# Patient Record
Sex: Female | Born: 1990 | Hispanic: Yes | Marital: Married | Smoking: Never smoker
Health system: Southern US, Community
[De-identification: ages and names within clinical notes are randomized; demographics above are authoritative.]

## PROBLEM LIST (undated history)

## (undated) ENCOUNTER — Inpatient Hospital Stay (HOSPITAL_COMMUNITY): Payer: Self-pay

## (undated) DIAGNOSIS — E039 Hypothyroidism, unspecified: Secondary | ICD-10-CM

## (undated) DIAGNOSIS — B009 Herpesviral infection, unspecified: Secondary | ICD-10-CM

## (undated) DIAGNOSIS — R7989 Other specified abnormal findings of blood chemistry: Secondary | ICD-10-CM

## (undated) HISTORY — PX: WISDOM TOOTH EXTRACTION: SHX21

---

## 2011-05-11 ENCOUNTER — Ambulatory Visit (INDEPENDENT_AMBULATORY_CARE_PROVIDER_SITE_OTHER): Payer: Self-pay | Admitting: Family Medicine

## 2011-05-11 VITALS — BP 101/66 | HR 60 | Temp 98.6°F | Resp 16 | Ht 65.58 in | Wt 164.0 lb

## 2011-05-11 DIAGNOSIS — R21 Rash and other nonspecific skin eruption: Secondary | ICD-10-CM

## 2011-05-11 MED ORDER — KETOCONAZOLE 2 % EX CREA: TOPICAL_CREAM | Freq: Two times a day (BID) | CUTANEOUS | Status: AC

## 2011-05-11 NOTE — Progress Notes (Signed)
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  O:  Papular/vesicular erythematous rash right gluteal cleft  A:  Tinea cruris  P:  Ketoconazole 2% cream bid x 7 days

## 2013-05-23 ENCOUNTER — Other Ambulatory Visit: Payer: Self-pay | Admitting: Family Medicine

## 2016-02-29 HISTORY — PX: RHINOPLASTY: SUR1284

## 2016-02-29 NOTE — L&D Delivery Note (Signed)
Delivery Note During second stage, heavy bleeding spontaneously occurred with clotting.  I assessed and determined it was from right vaginal sulcus laceration.  The bleeding was tamponaded by the fetal head.  At 7:05 PM a viable female was delivered via Vaginal, Spontaneous (Presentation: ROA).  APGAR: 9, 9; weight pending.   Placenta status: S, I. 3V Cord with the following complications: none. Cord pH: n/a  Anesthesia: CLEA Episiotomy: None Lacerations: 2nd degree;Perineal; right vaginal sulcus Suture Repair: 3.0 vicryl Est. Blood Loss (mL): 600 (including pre-delivery laceration bleeding; 300 postpartum bleeding)  Mom to postpartum.  Baby to Couplet care / Skin to Skin.  Jody Carr 01/20/2017, 7:48 PM

## 2016-05-31 ENCOUNTER — Encounter (HOSPITAL_COMMUNITY): Payer: Self-pay | Admitting: Emergency Medicine

## 2016-05-31 DIAGNOSIS — Z3A01 Less than 8 weeks gestation of pregnancy: Secondary | ICD-10-CM | POA: Insufficient documentation

## 2016-05-31 DIAGNOSIS — O468X1 Other antepartum hemorrhage, first trimester: Secondary | ICD-10-CM | POA: Insufficient documentation

## 2016-05-31 DIAGNOSIS — O208 Other hemorrhage in early pregnancy: Secondary | ICD-10-CM | POA: Diagnosis present

## 2016-05-31 DIAGNOSIS — O0281 Inappropriate change in quantitative human chorionic gonadotropin (hCG) in early pregnancy: Secondary | ICD-10-CM | POA: Diagnosis not present

## 2016-05-31 LAB — CBC WITH DIFFERENTIAL/PLATELET
BASOS PCT: 0 %
Basophils Absolute: 0 10*3/uL (ref 0.0–0.1)
EOS ABS: 0.1 10*3/uL (ref 0.0–0.7)
EOS PCT: 1 %
HCT: 36.7 % (ref 36.0–46.0)
Hemoglobin: 12.7 g/dL (ref 12.0–15.0)
Lymphocytes Relative: 20 %
Lymphs Abs: 2 10*3/uL (ref 0.7–4.0)
MCH: 29.3 pg (ref 26.0–34.0)
MCHC: 34.6 g/dL (ref 30.0–36.0)
MCV: 84.6 fL (ref 78.0–100.0)
MONO ABS: 0.6 10*3/uL (ref 0.1–1.0)
MONOS PCT: 6 %
Neutro Abs: 7 10*3/uL (ref 1.7–7.7)
Neutrophils Relative %: 73 %
Platelets: 269 10*3/uL (ref 150–400)
RBC: 4.34 MIL/uL (ref 3.87–5.11)
RDW: 12.5 % (ref 11.5–15.5)
WBC: 9.8 10*3/uL (ref 4.0–10.5)

## 2016-05-31 LAB — I-STAT CHEM 8, ED
BUN: 5 mg/dL — ABNORMAL LOW (ref 6–20)
CALCIUM ION: 1.19 mmol/L (ref 1.15–1.40)
Chloride: 99 mmol/L — ABNORMAL LOW (ref 101–111)
Creatinine, Ser: 0.5 mg/dL (ref 0.44–1.00)
Glucose, Bld: 86 mg/dL (ref 65–99)
HEMATOCRIT: 36 % (ref 36.0–46.0)
Hemoglobin: 12.2 g/dL (ref 12.0–15.0)
Potassium: 3.4 mmol/L — ABNORMAL LOW (ref 3.5–5.1)
SODIUM: 137 mmol/L (ref 135–145)
TCO2: 26 mmol/L (ref 0–100)

## 2016-05-31 LAB — ABO/RH: ABO/RH(D): O POS

## 2016-05-31 LAB — HCG, QUANTITATIVE, PREGNANCY: HCG, BETA CHAIN, QUANT, S: 61151 m[IU]/mL — AB (ref <5)

## 2016-05-31 NOTE — ED Triage Notes (Signed)
Patient here with vaginal bleeding.  Patient denies any pain at this time.  Patient states that the bleeding started earlier today.  The bleeding has slowed at this time.  Patient states she is [redacted] weeks pregnant, was supposed to see OBGYN next week.

## 2016-06-01 ENCOUNTER — Emergency Department (HOSPITAL_COMMUNITY)
Admission: EM | Admit: 2016-06-01 | Discharge: 2016-06-01 | Disposition: A | Discharge status: Home / Self Care | Payer: Commercial Managed Care - PPO | Attending: Emergency Medicine | Admitting: Emergency Medicine

## 2016-06-01 ENCOUNTER — Emergency Department (HOSPITAL_COMMUNITY): Payer: Commercial Managed Care - PPO

## 2016-06-01 DIAGNOSIS — O418X10 Other specified disorders of amniotic fluid and membranes, first trimester, not applicable or unspecified: Secondary | ICD-10-CM

## 2016-06-01 DIAGNOSIS — O200 Threatened abortion: Secondary | ICD-10-CM

## 2016-06-01 DIAGNOSIS — O468X1 Other antepartum hemorrhage, first trimester: Secondary | ICD-10-CM

## 2016-06-01 DIAGNOSIS — R58 Hemorrhage, not elsewhere classified: Secondary | ICD-10-CM

## 2016-06-01 NOTE — ED Notes (Signed)
Patient transported to US 

## 2016-06-01 NOTE — Discharge Instructions (Signed)
If you had worsening abdominal pain, worsening bleeding, bleeding through more than 1 pad per hour or had any new or worsening symptoms she needs to be reevaluated. Your ultrasound shows a 6 week, 5 day intrauterine pregnancy.

## 2016-06-01 NOTE — ED Provider Notes (Signed)
[facility]-EMERGENCY DEPT Provider Note   CSN: [id number] Arrival date & time: 05/31/16  2017  By signing my name below, I, Jody Carr, attest that this documentation has been prepared under the direction and in the presence of Shon Baton, MD.  Electronically Signed: Octavia Heir, ED Scribe. 06/01/16. 2:30 AM.    History   Chief Complaint Chief Complaint  Patient presents with  . Vaginal Bleeding    The history is provided by the patient. No language interpreter was used.   HPI Comments: Jody Carr is a 26 y.o. female who presents to the Emergency Department complaining of acute onset, intermittent, vaginal bleeding that began ~ 5 hours ago. She reports that she has been having mild-moderate amounts of blood. She reports associated mild abdominal cramping. Pt further states that she was feeling more fatigued than normal today. She states she is currently ~ [redacted] weeks pregnant. Her LNMP was Feb 14th. This is pt's first pregnancy. She has her first appointment with Ob/Gyn next week. Pt denies dysuria, hematuria, urinary urgency, or urinary frequency.  History reviewed. No pertinent past medical history.  There are no active problems to display for this patient.   History reviewed. No pertinent surgical history.  OB History    Gravida Para Term Preterm AB Living   1             SAB TAB Ectopic Multiple Live Births                   Home Medications    Prior to Admission medications   Medication Sig Start Date End Date Taking? Authorizing Provider  acyclovir (ZOVIRAX) 200 MG capsule Take 200 mg by mouth daily as needed.    Historical Provider, MD    Family History History reviewed. No pertinent family history.  Social History Social History  Substance Use Topics  . Smoking status: Never Smoker  . Smokeless tobacco: Never Used  . Alcohol use Not on file     Allergies   Patient has no known allergies.   Review of Systems Review of Systems    Constitutional: Positive for fatigue.  Gastrointestinal: Positive for abdominal pain (cramping).  Genitourinary: Positive for vaginal bleeding. Negative for dysuria, hematuria and urgency.  All other systems reviewed and are negative.    Physical Exam Updated Vital Signs BP 110/66   Pulse 84   Temp 97.9 F (36.6 C) (Oral)   Resp 18   LMP 04/13/2016 (Exact Date)   SpO2 100%   Physical Exam  Constitutional: She is oriented to person, place, and time. She appears well-developed and well-nourished.  HENT:  Head: Normocephalic and atraumatic.  Cardiovascular: Normal rate, regular rhythm and normal heart sounds.   Pulmonary/Chest: Effort normal and breath sounds normal. No respiratory distress. She has no wheezes.  Abdominal: Soft. Bowel sounds are normal. There is no tenderness. There is no guarding.  Genitourinary:  Genitourinary Comments: Moderate amount of dark blood in the vaginal vault, no obvious clots or products of conception, cervical os closed  Neurological: She is alert and oriented to person, place, and time.  Skin: Skin is warm and dry.  Psychiatric: She has a normal mood and affect.  Nursing note and vitals reviewed.    ED Treatments / Results  DIAGNOSTIC STUDIES: Oxygen Saturation is 100% on RA, normal by my interpretation.  COORDINATION OF CARE:  2:29 AM Discussed treatment plan with pt at bedside and pt agreed to plan.  Labs (all labs ordered are  listed, but only abnormal results are displayed) Labs Reviewed  HCG, QUANTITATIVE, PREGNANCY - Abnormal; Notable for the following:       Result Value   hCG, Beta Chain, Quant, S 61,151 (*)    All other components within normal limits  I-STAT CHEM 8, ED - Abnormal; Notable for the following:    Potassium 3.4 (*)    Chloride 99 (*)    BUN 5 (*)    All other components within normal limits  CBC WITH DIFFERENTIAL/PLATELET  ABO/RH    EKG  EKG Interpretation None       Radiology US Ob Comp Less 14  Wks  Result Date: 06/01/2016 CLINICAL DATA:  26 y/o  F; vaginal bleeding. EXAM: OBSTETRIC <14 WK [place] AND TRANSVAGINAL OB US TECHNIQUE: Both transabdominal and transvaginal ultrasound examinations were performed for complete evaluation of the gestation as well as the maternal uterus, adnexal regions, and pelvic cul-de-sac. Transvaginal technique was performed to assess early pregnancy. COMPARISON:  None. FINDINGS: Intrauterine gestational sac: Single Yolk sac:  Visualized. Embryo:  Visualized. Cardiac Activity: Visualized. Heart Rate: 147  bpm CRL:  8  mm   6 w   5 d                  [place] EDC: 01/20/2017 Subchorionic hemorrhage:  Small. Maternal uterus/adnexae: Lower uterine segment fibroid measuring up to 1.1 cm. Trace physiologic fluid in the pelvis. Otherwise normal. IMPRESSION: 1. Single live intrauterine pregnancy with estimated gestational age of [redacted] weeks and 5 days. 2. Small subchorionic hemorrhage. 3. Lower uterine segment 1.1 cm fibroid. Electronically Signed   By: Mitzi Hansen M.D.   On: 06/01/2016 03:47   US Ob Transvaginal  Result Date: 06/01/2016 CLINICAL DATA:  26 y/o  F; vaginal bleeding. EXAM: OBSTETRIC <14 WK [place] AND TRANSVAGINAL OB US TECHNIQUE: Both transabdominal and transvaginal ultrasound examinations were performed for complete evaluation of the gestation as well as the maternal uterus, adnexal regions, and pelvic cul-de-sac. Transvaginal technique was performed to assess early pregnancy. COMPARISON:  None. FINDINGS: Intrauterine gestational sac: Single Yolk sac:  Visualized. Embryo:  Visualized. Cardiac Activity: Visualized. Heart Rate: 147  bpm CRL:  8  mm   6 w   5 d                  [place] EDC: 01/20/2017 Subchorionic hemorrhage:  Small. Maternal uterus/adnexae: Lower uterine segment fibroid measuring up to 1.1 cm. Trace physiologic fluid in the pelvis. Otherwise normal. IMPRESSION: 1. Single live intrauterine pregnancy with estimated gestational age of [redacted] weeks and 5 days. 2. Small  subchorionic hemorrhage. 3. Lower uterine segment 1.1 cm fibroid. Electronically Signed   By: Mitzi Hansen M.D.   On: 06/01/2016 03:47    Procedures Procedures (including critical care time)  Medications Ordered in ED Medications - No data to display   Initial Impression / Assessment and Plan / ED Course  I have reviewed the triage vital signs and the nursing notes.  Pertinent labs & imaging results that were available during my care of the patient were reviewed by me and considered in my medical decision making (see chart for details).     Patient presents with bleeding during early pregnancy. Nontoxic-appearing. Dark blood in the vaginal vault. Cervical os is closed. Transvaginal ultrasound shows a small subchorionic hemorrhage with a single live intrauterine pregnancy.  Patient was given bleeding precautions. Follow-up with OB. No indication at this time for exam.  After history, exam, and medical workup  I feel the patient has been appropriately medically screened and is safe for discharge home. Pertinent diagnoses were discussed with the patient. Patient was given return precautions.   Final Clinical Impressions(s) / ED Diagnoses   Final diagnoses:  Threatened miscarriage  Subchorionic hematoma in first trimester, single or unspecified fetus    I personally performed the services described in this documentation, which was scribed in my presence. The recorded information has been reviewed and is accurate.  New Prescriptions New Prescriptions   No medications on file     Shon Baton, MD 06/01/16 [phone]

## 2016-06-13 LAB — OB RESULTS CONSOLE HEPATITIS B SURFACE ANTIGEN: HEP B S AG: NEGATIVE

## 2016-06-13 LAB — OB RESULTS CONSOLE GC/CHLAMYDIA
Chlamydia: NEGATIVE
GC PROBE AMP, GENITAL: NEGATIVE

## 2016-06-13 LAB — OB RESULTS CONSOLE HIV ANTIBODY (ROUTINE TESTING): HIV: NONREACTIVE

## 2016-06-13 LAB — OB RESULTS CONSOLE RPR: RPR: NONREACTIVE

## 2016-06-13 LAB — OB RESULTS CONSOLE RUBELLA ANTIBODY, IGM: RUBELLA: IMMUNE

## 2016-07-09 ENCOUNTER — Inpatient Hospital Stay (HOSPITAL_COMMUNITY): Payer: Commercial Managed Care - PPO

## 2016-07-09 ENCOUNTER — Inpatient Hospital Stay (HOSPITAL_COMMUNITY)
Admission: AD | Admit: 2016-07-09 | Discharge: 2016-07-09 | Disposition: A | Discharge status: Home / Self Care | Payer: Commercial Managed Care - PPO | Source: Ambulatory Visit | Attending: Obstetrics and Gynecology | Admitting: Obstetrics and Gynecology

## 2016-07-09 ENCOUNTER — Encounter: Payer: Self-pay | Admitting: Student

## 2016-07-09 DIAGNOSIS — N898 Other specified noninflammatory disorders of vagina: Secondary | ICD-10-CM | POA: Diagnosis not present

## 2016-07-09 DIAGNOSIS — O209 Hemorrhage in early pregnancy, unspecified: Secondary | ICD-10-CM | POA: Diagnosis not present

## 2016-07-09 DIAGNOSIS — O26891 Other specified pregnancy related conditions, first trimester: Secondary | ICD-10-CM | POA: Diagnosis not present

## 2016-07-09 DIAGNOSIS — Z3A12 12 weeks gestation of pregnancy: Secondary | ICD-10-CM | POA: Insufficient documentation

## 2016-07-09 DIAGNOSIS — O208 Other hemorrhage in early pregnancy: Secondary | ICD-10-CM | POA: Insufficient documentation

## 2016-07-09 DIAGNOSIS — Z3491 Encounter for supervision of normal pregnancy, unspecified, first trimester: Secondary | ICD-10-CM

## 2016-07-09 HISTORY — DX: Herpesviral infection, unspecified: B00.9

## 2016-07-09 HISTORY — DX: Other specified abnormal findings of blood chemistry: R79.89

## 2016-07-09 LAB — URINALYSIS, ROUTINE W REFLEX MICROSCOPIC
BILIRUBIN URINE: NEGATIVE
Glucose, UA: NEGATIVE mg/dL
Ketones, ur: NEGATIVE mg/dL
LEUKOCYTES UA: NEGATIVE
NITRITE: NEGATIVE
PH: 7 (ref 5.0–8.0)
Protein, ur: NEGATIVE mg/dL
SPECIFIC GRAVITY, URINE: 1.006 (ref 1.005–1.030)

## 2016-07-09 LAB — POCT FERN TEST: POCT Fern Test: NEGATIVE

## 2016-07-09 IMAGING — US US OB COMP LESS 14 WK
1 series · 15 of 28 positions shown · non-contrast
Comparison: Pelvic ultrasound performed [DATE]

CLINICAL DATA: Acute onset of vaginal spotting. Subsequent
encounter.

EXAM:
OBSTETRIC <14 WK ULTRASOUND
TECHNIQUE: Transabdominal ultrasound was performed for evaluation of the
gestation as well as the maternal uterus and adnexal regions.

[Series 1: us ob comp less 14 wk · 15 of 35 slices shown]
[im 1/35]
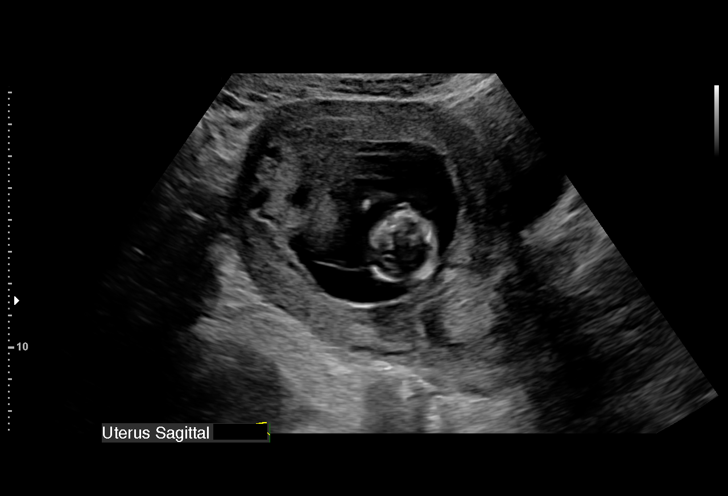
[im 3/35]
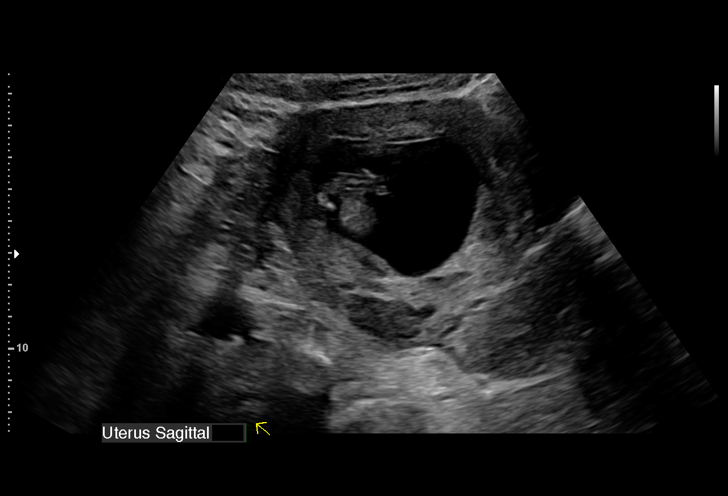
[im 6/35]
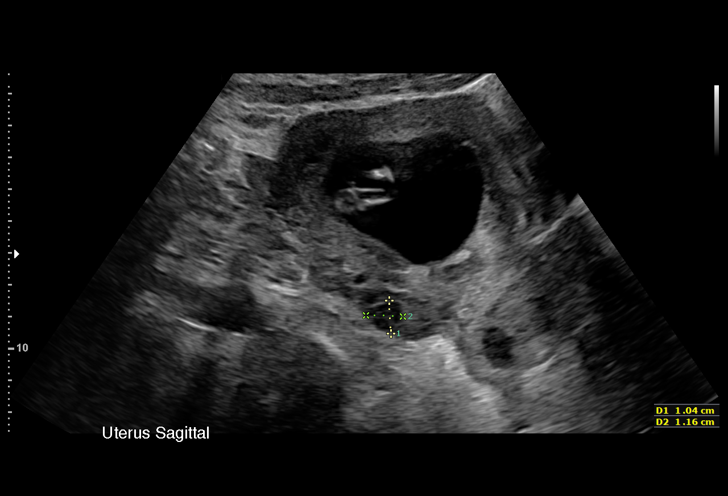
[im 8/35]
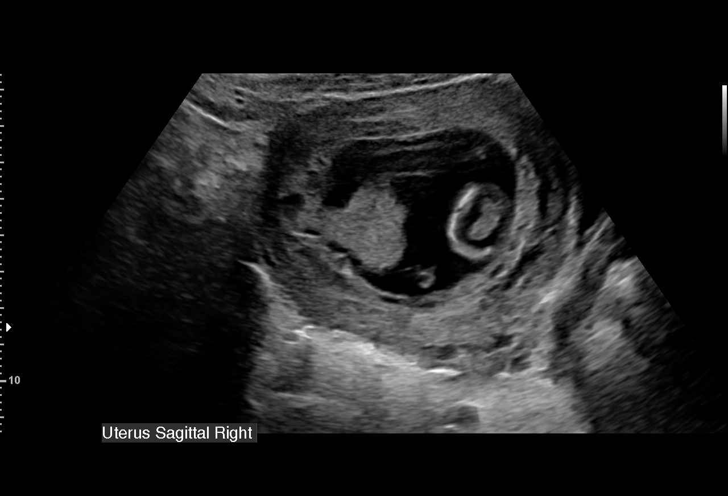
[im 11/35]
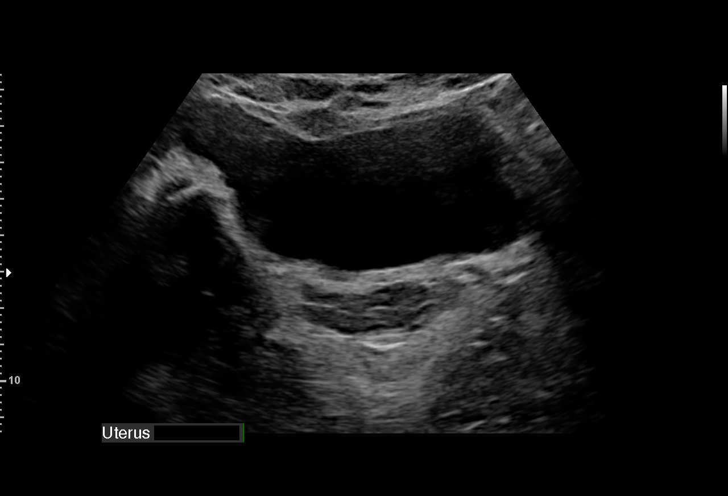
[im 13/35]
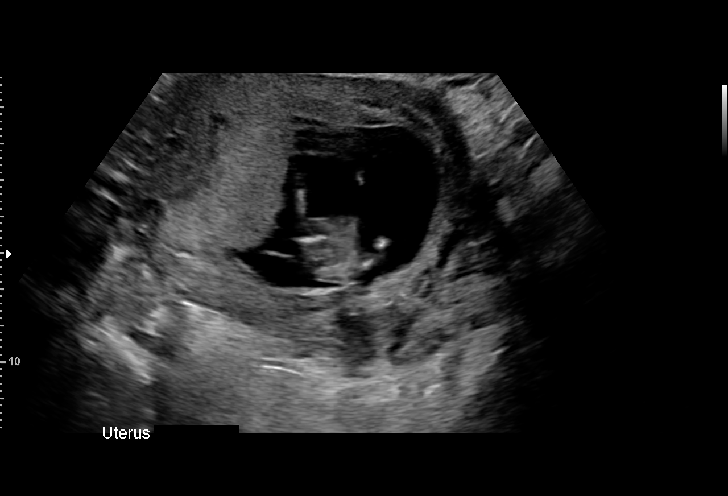
[im 16/35]
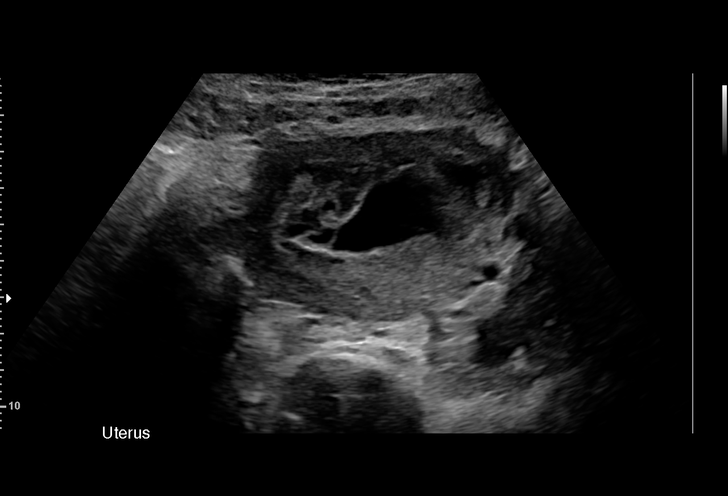
[im 18/35]
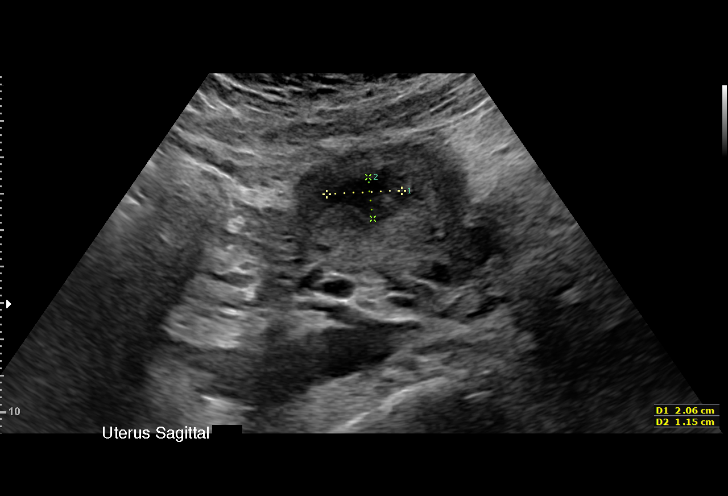
[im 19/35]
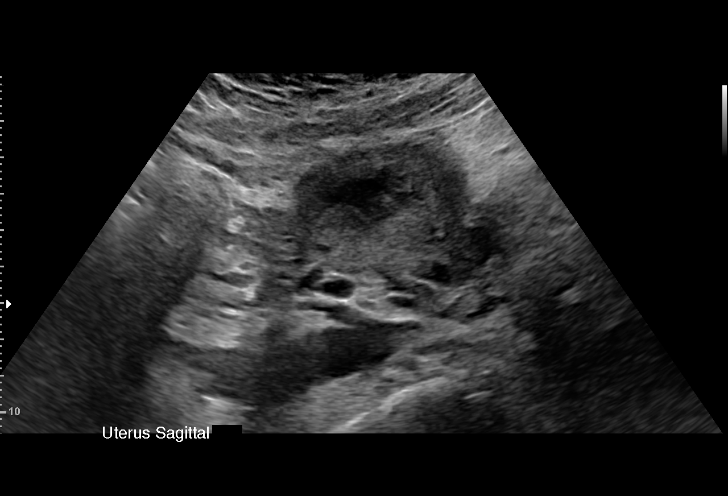
[im 22/35]
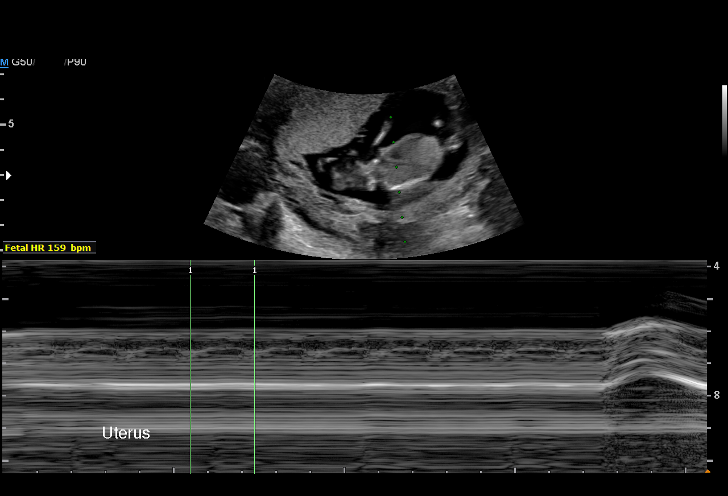
[im 24/35]
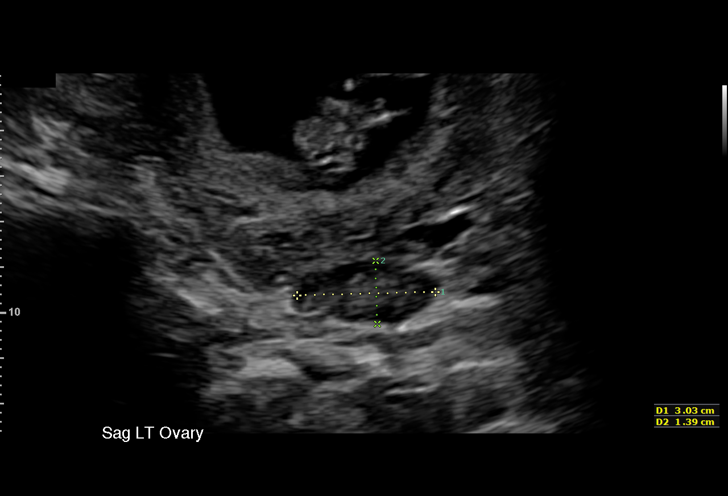
[im 27/35]
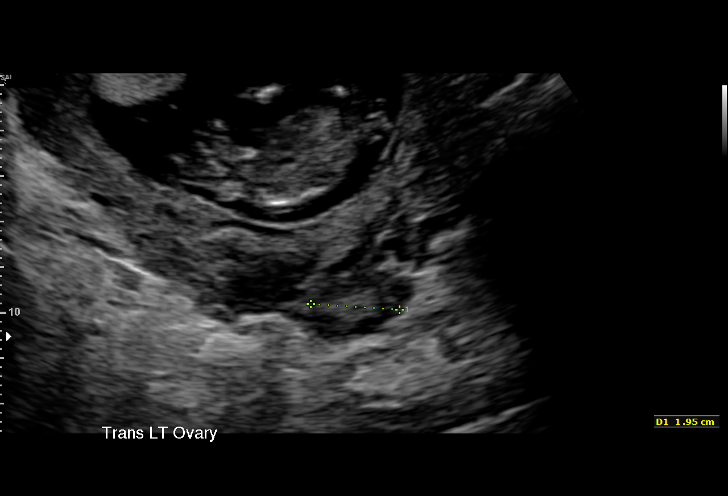
[im 29/35]
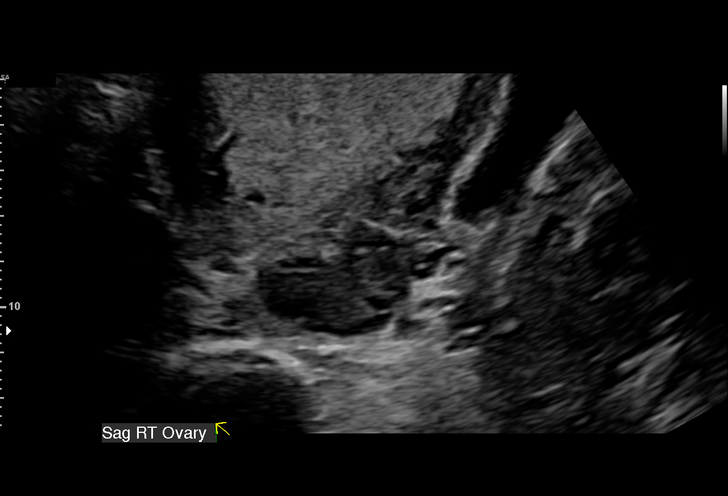
[im 32/35]
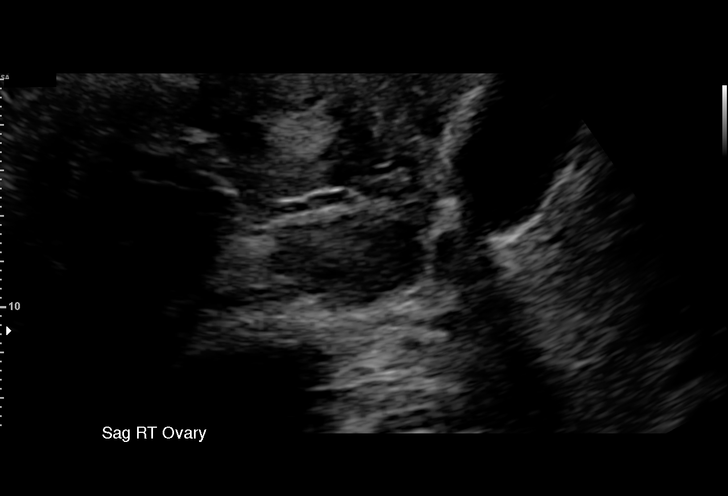
[im 35/35]
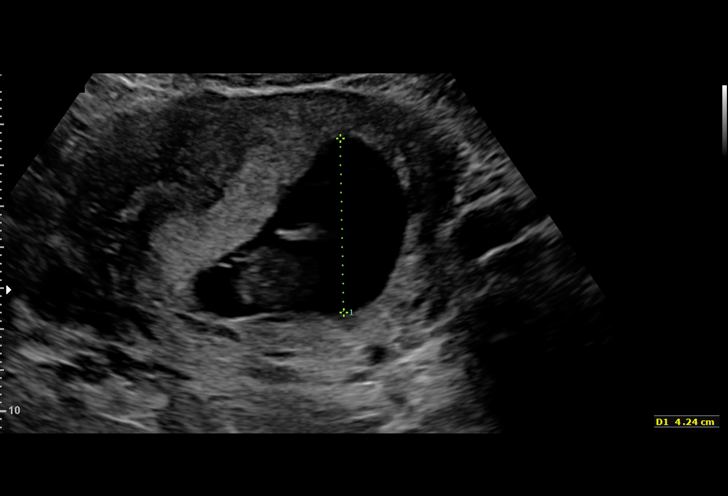

[15 of 28 positions shown; findings below may reference images not displayed]

FINDINGS: Intrauterine gestational sac: Single; visualized and normal in
shape.

Yolk sac:  No

Embryo:  Yes

Cardiac Activity: Yes

Heart Rate: 159 bpm

CRL:   6.18 cm   12 w 4 d                  US EDC: [DATE]

Subchorionic hemorrhage: A small amount of subchorionic hemorrhage
is noted at the uterine fundus.

Maternal uterus/adnexae: A 1.5 cm fibroid is noted at the lower
uterine segment. The uterus is otherwise unremarkable. A
subjectively normal amount of amniotic fluid is seen.

The ovaries are within normal limits. The right ovary measures 3.1 x
2.5 x 1.7 cm, while the left ovary measures 3.0 x 1.4 x 2.0 cm. No
suspicious adnexal masses are seen; there is no evidence for ovarian
torsion.

No free fluid is seen within the pelvic cul-de-sac.
IMPRESSION: 1. Single live intrauterine pregnancy noted, with a crown-rump
length of 6.2 cm, corresponding to a gestational age of 12 weeks 4
days. This matches the gestational age of 12 weeks 3 days by LMP,
reflecting an estimated date of delivery [DATE].
2. Normal amount of amniotic fluid noted.
3. Small amount of subchorionic hemorrhage noted at the uterine
fundus.
4. Small uterine fibroid seen.

## 2016-07-09 NOTE — MAU Provider Note (Signed)
History     CSN: [id number]  Arrival date and time: 07/09/16 0135  First Provider Initiated Contact with Patient 07/09/16 0225      Chief Complaint  Patient presents with  . Rupture of Membranes   HPI Jody Carr is a 26 y.o. G1P0 at 6973w3d who presents with vaginal discharge. Reports leaking blood tinged fluid since 0045 this morning. Describes as clear watery fluid initially that turned blood tinged. Fluid continues to leak out; worse with movements and walking. No odor to discharge. Denies abdominal pain. No recent intercourse. Denies dysuria, n/v/d, fever, or vaginal bleeding.   OB History    Gravida Para Term Preterm AB Living   1             SAB TAB Ectopic Multiple Live Births                  Past Medical History:  Diagnosis Date  . Abnormal TSH   . HSV (herpes simplex virus) infection     Past Surgical History:  Procedure Laterality Date  . RHINOPLASTY  02/2016    Family History  Problem Relation Age of Onset  . Depression Mother   . Hypertension Maternal Grandmother   . Congestive Heart Failure Maternal Grandmother   . Diabetes Paternal Grandmother   . Stroke Paternal Grandfather     Social History  Substance Use Topics  . Smoking status: Never Smoker  . Smokeless tobacco: Never Used  . Alcohol use No    Allergies: No Known Allergies  Prescriptions Prior to Admission  Medication Sig Dispense Refill Last Dose  . acyclovir (ZOVIRAX) 200 MG capsule Take 200 mg by mouth daily as needed.   Taking    Review of Systems  Constitutional: Negative.   Gastrointestinal: Negative.   Genitourinary: Positive for vaginal bleeding and vaginal discharge. Negative for dysuria.   Physical Exam   Blood pressure 121/75, pulse 77, temperature 98.1 F (36.7 C), temperature source Oral, resp. rate 18, height 5' 6.5" (1.689 m), weight 175 lb 4 oz (79.5 kg), last menstrual period 04/13/2016, SpO2 100 %.  Physical Exam  Nursing note and vitals  reviewed. Constitutional: She is oriented to person, place, and time. She appears well-developed and well-nourished. No distress.  HENT:  Head: Normocephalic and atraumatic.  Eyes: Conjunctivae are normal. Right eye exhibits no discharge. Left eye exhibits no discharge. No scleral icterus.  Neck: Normal range of motion.  Respiratory: Effort normal. No respiratory distress.  Genitourinary:  Genitourinary Comments: + pooling. Moderate amount of blood tinged watery fluid. Cervix visually closed.   Neurological: She is alert and oriented to person, place, and time.  Skin: Skin is warm and dry. She is not diaphoretic.  Psychiatric: She has a normal mood and affect. Her behavior is normal. Judgment and thought content normal.    MAU Course  Procedures Results for orders placed or performed during the hospital encounter of 07/09/16 (from the past 24 hour(s))  Urinalysis, Routine w reflex microscopic     Status: Abnormal   Collection Time: 07/09/16  1:54 AM  Result Value Ref Range   Color, Urine STRAW (A) YELLOW   APPearance CLEAR CLEAR   Specific Gravity, Urine 1.006 1.005 - 1.030   pH 7.0 5.0 - 8.0   Glucose, UA NEGATIVE NEGATIVE mg/dL   Hgb urine dipstick SMALL (A) NEGATIVE   Bilirubin Urine NEGATIVE NEGATIVE   Ketones, ur NEGATIVE NEGATIVE mg/dL   Protein, ur NEGATIVE NEGATIVE mg/dL   Nitrite NEGATIVE NEGATIVE  Leukocytes, UA NEGATIVE NEGATIVE   RBC / HPF 0-5 0 - 5 RBC/hpf   WBC, UA 0-5 0 - 5 WBC/hpf   Bacteria, UA RARE (A) NONE SEEN   Squamous Epithelial / LPF 0-5 (A) NONE SEEN   Mucous PRESENT   POCT fern test     Status: None   Collection Time: 07/09/16  3:36 AM  Result Value Ref Range   POCT Fern Test Negative = intact amniotic membranes    US Ob Comp Less 14 Wks  Result Date: 07/09/2016 CLINICAL DATA:  Acute onset of vaginal spotting. Subsequent encounter. EXAM: OBSTETRIC <14 WK ULTRASOUND TECHNIQUE: Transabdominal ultrasound was performed for evaluation of the gestation  as well as the maternal uterus and adnexal regions. COMPARISON:  Pelvic ultrasound performed 06/01/2016 FINDINGS: Intrauterine gestational sac: Single; visualized and normal in shape. Yolk sac:  No Embryo:  Yes Cardiac Activity: Yes Heart Rate: 159 bpm CRL:   6.18 cm   12 w 4 d                  [place] EDC: 01/17/2017 Subchorionic hemorrhage: A small amount of subchorionic hemorrhage is noted at the uterine fundus. Maternal uterus/adnexae: A 1.5 cm fibroid is noted at the lower uterine segment. The uterus is otherwise unremarkable. A subjectively normal amount of amniotic fluid is seen. The ovaries are within normal limits. The right ovary measures 3.1 x 2.5 x 1.7 cm, while the left ovary measures 3.0 x 1.4 x 2.0 cm. No suspicious adnexal masses are seen; there is no evidence for ovarian torsion. No free fluid is seen within the pelvic cul-de-sac. IMPRESSION: 1. Single live intrauterine pregnancy noted, with a crown-rump length of 6.2 cm, corresponding to a gestational age of [redacted] weeks 4 days. This matches the gestational age of [redacted] weeks 3 days by LMP, reflecting an estimated date of delivery of January 18, 2017. 2. Normal amount of amniotic fluid noted. 3. Small amount of subchorionic hemorrhage noted at the uterine fundus. 4. Small uterine fibroid seen. Electronically Signed   By: Roanna Raider M.D.   On: 07/09/2016 03:07     MDM FHT 155 O positive Cervix visually closed + pooling, negative fern Ultrasound -- subjectively normal AVF; 1.5 cm fibroid & small [facility] consistent with last month's study C/w Dr. Henderson Cloud. Discussed exam, fern, & ultrasound. No evidence of infection at this time. Ok to Discharge home. Pt has appt in office on Monday.  Assessment and Plan  A: 1. Fetal heart tones present, first trimester   2. Vaginal bleeding in pregnancy, first trimester   3. Vaginal discharge during pregnancy in first trimester    P: Discharge home Pelvic rest Discussed reasons to return to MAU (including  signs of infection) Keep appt in office on Monday  Judeth Horn 07/09/2016, 2:25 AM

## 2016-07-09 NOTE — MAU Note (Signed)
Pink watery fluid coming out since 1245am, if I move more comes out. Last intercourse a week ago.  No pain.

## 2016-07-09 NOTE — Discharge Instructions (Signed)
Return to care   If you have heavier bleeding that soaks through more that 2 pads per hour for an hour or more  If you bleed so much that you feel like you might pass out or you do pass out  If you have significant abdominal pain that is not improved with Tylenol   If you develop a fever > 100.5      Pelvic Rest Pelvic rest may be recommended if:  Your placenta is partially or completely covering the opening of your cervix (placenta previa).  There is bleeding between the wall of the uterus and the amniotic sac in the first trimester of pregnancy (subchorionic hemorrhage).  You went into labor too early (preterm labor). Based on your overall health and the health of your baby, your health care provider will decide if pelvic rest is right for you. How do I rest my pelvis? For as long as told by your health care provider:  Do not have sex, sexual stimulation, or an orgasm.  Do not use tampons. Do not douche. Do not put anything in your vagina.  Do not lift anything that is heavier than 10 lb (4.5 kg).  Avoid activities that take a lot of effort (are strenuous).  Avoid any activity in which your pelvic muscles could become strained. When should I seek medical care? Seek medical care if you have:  Cramping pain in your lower abdomen.  Vaginal discharge.  A low, dull backache.  Regular contractions.  Uterine tightening. When should I seek immediate medical care? Seek immediate medical care if:  You have vaginal bleeding and you are pregnant. This information is not intended to replace advice given to you by your health care provider. Make sure you discuss any questions you have with your health care provider. Document Released: 06/11/2010 Document Revised: 07/23/2015 Document Reviewed: 08/18/2014 Elsevier Interactive Patient Education  2017 [employer]

## 2016-08-03 ENCOUNTER — Inpatient Hospital Stay (HOSPITAL_COMMUNITY)
Admission: AD | Admit: 2016-08-03 | Payer: Commercial Managed Care - PPO | Source: Ambulatory Visit | Admitting: Obstetrics & Gynecology

## 2016-12-20 LAB — OB RESULTS CONSOLE GBS: GBS: NEGATIVE

## 2017-01-19 ENCOUNTER — Encounter (HOSPITAL_COMMUNITY): Payer: Self-pay

## 2017-01-19 ENCOUNTER — Other Ambulatory Visit: Payer: Self-pay

## 2017-01-19 ENCOUNTER — Inpatient Hospital Stay (HOSPITAL_COMMUNITY)
Admission: AD | Admit: 2017-01-19 | Discharge: 2017-01-20 | Disposition: A | Discharge status: Home / Self Care | Payer: Commercial Managed Care - PPO | Source: Ambulatory Visit | Attending: Obstetrics & Gynecology | Admitting: Obstetrics & Gynecology

## 2017-01-19 ENCOUNTER — Inpatient Hospital Stay (HOSPITAL_COMMUNITY)
Admission: AD | Admit: 2017-01-19 | Discharge: 2017-01-19 | Disposition: A | Discharge status: Home / Self Care | Payer: Commercial Managed Care - PPO | Source: Ambulatory Visit | Attending: Obstetrics & Gynecology | Admitting: Obstetrics & Gynecology

## 2017-01-19 DIAGNOSIS — O479 False labor, unspecified: Secondary | ICD-10-CM

## 2017-01-19 MED ORDER — PROMETHAZINE HCL 25 MG/ML IJ SOLN
25.0000 mg | Freq: Once | INTRAMUSCULAR | Status: AC
  Administered 2017-01-19: 25 mg via INTRAMUSCULAR
  Filled 2017-01-19: qty 1

## 2017-01-19 MED ORDER — MORPHINE SULFATE (PF) 4 MG/ML IV SOLN
5.0000 mg | Freq: Once | INTRAVENOUS | Status: AC
  Administered 2017-01-19: 5 mg via INTRAMUSCULAR
  Filled 2017-01-19: qty 2

## 2017-01-19 NOTE — MAU Note (Signed)
I have communicated with Dr. Langston MaskerMorris and reviewed vital signs:  Vitals:   01/19/17 2120 01/19/17 2330  BP: 128/89 118/76  Pulse: (!) 101 83  Resp: 16   Temp: 98 F (36.7 C)   SpO2: 100%     Vaginal exam:  Dilation: 1 Effacement (%): 90 Cervical Position: Posterior Station: -3 Presentation: Vertex Exam by:: Camelia Enganielle Simpson, RN,   Also reviewed contraction pattern and that non-stress test is reactive.  It has been documented that patient is contracting every 10 minutes with no cervical change over 1 hour not indicating active labor.  Patient denies any other complaints.  Based on this report provider has given order for discharge.  A discharge order and diagnosis entered by a provider.   Labor discharge instructions reviewed with patient.

## 2017-01-19 NOTE — MAU Note (Signed)
Contractions started at 6 p.m. Last night. Got to every 6 min, then started slowing down, now stuck at every .  Some bleeding after exam yesterday, was 1 cm when checked. No leaking. No problems with preg.

## 2017-01-19 NOTE — Discharge Instructions (Signed)
Fetal Movement Counts °Patient Name: ________________________________________________ Patient Due Date: ____________________ °What is a fetal movement count? °A fetal movement count is the number of times that you feel your baby move during a certain amount of time. This may also be called a fetal kick count. A fetal movement count is recommended for every pregnant woman. You may be asked to start counting fetal movements as early as week 28 of your pregnancy. °Pay attention to when your baby is most active. You may notice your baby's sleep and wake cycles. You may also notice things that make your baby move more. You should do a fetal movement count: °· When your baby is normally most active. °· At the same time each day. ° °A good time to count movements is while you are resting, after having something to eat and drink. °How do I count fetal movements? °1. Find a quiet, comfortable area. Sit, or lie down on your side. °2. Write down the date, the start time and stop time, and the number of movements that you felt between those two times. Take this information with you to your health care visits. °3. For 2 hours, count kicks, flutters, swishes, rolls, and jabs. You should feel at least 10 movements during 2 hours. °4. You may stop counting after you have felt 10 movements. °5. If you do not feel 10 movements in 2 hours, have something to eat and drink. Then, keep resting and counting for 1 hour. If you feel at least 4 movements during that hour, you may stop counting. °Contact a health care provider if: °· You feel fewer than 4 movements in 2 hours. °· Your baby is not moving like he or she usually does. °Date: ____________ Start time: ____________ Stop time: ____________ Movements: ____________ °Date: ____________ Start time: ____________ Stop time: ____________ Movements: ____________ °Date: ____________ Start time: ____________ Stop time: ____________ Movements: ____________ °Date: ____________ Start time:  ____________ Stop time: ____________ Movements: ____________ °Date: ____________ Start time: ____________ Stop time: ____________ Movements: ____________ °Date: ____________ Start time: ____________ Stop time: ____________ Movements: ____________ °Date: ____________ Start time: ____________ Stop time: ____________ Movements: ____________ °Date: ____________ Start time: ____________ Stop time: ____________ Movements: ____________ °Date: ____________ Start time: ____________ Stop time: ____________ Movements: ____________ °This information is not intended to replace advice given to you by your health care provider. Make sure you discuss any questions you have with your health care provider. °Document Released: 03/16/2006 Document Revised: 10/14/2015 Document Reviewed: 03/26/2015 °Elsevier Interactive Patient Education © 2018 [employer] °Braxton Hicks Contractions °Contractions of the uterus can occur throughout pregnancy, but they are not always a sign that you are in labor. You may have practice contractions called Braxton Hicks contractions. These false labor contractions are sometimes confused with true labor. °What are Braxton Hicks contractions? °Braxton Hicks contractions are tightening movements that occur in the muscles of the uterus before labor. Unlike true labor contractions, these contractions do not result in opening (dilation) and thinning of the cervix. Toward the end of pregnancy (32-34 weeks), Braxton Hicks contractions can happen more often and may become stronger. These contractions are sometimes difficult to tell apart from true labor because they can be very uncomfortable. You should not feel embarrassed if you go to the hospital with false labor. °Sometimes, the only way to tell if you are in true labor is for your health care provider to look for changes in the cervix. The health care provider will do a physical exam and may monitor your contractions. If   you are not in true labor, the exam  should show that your cervix is not dilating and your water has not broken. °If there are no prenatal problems or other health problems associated with your pregnancy, it is completely safe for you to be sent home with false labor. You may continue to have Braxton Hicks contractions until you go into true labor. °How can I tell the difference between true labor and false labor? °· Differences °? False labor °? Contractions last 30-70 seconds.: Contractions are usually shorter and not as strong as true labor contractions. °? Contractions become very regular.: Contractions are usually irregular. °? Discomfort is usually felt in the top of the uterus, and it spreads to the lower abdomen and low back.: Contractions are often felt in the front of the lower abdomen and in the groin. °? Contractions do not go away with walking.: Contractions may go away when you walk around or change positions while lying down. °? Contractions usually become more intense and increase in frequency.: Contractions get weaker and are shorter-lasting as time goes on. °? The cervix dilates and gets thinner.: The cervix usually does not dilate or become thin. °Follow these instructions at home: °· Take over-the-counter and prescription medicines only as told by your health care provider. °· Keep up with your usual exercises and follow other instructions from your health care provider. °· Eat and drink lightly if you think you are going into labor. °· If Braxton Hicks contractions are making you uncomfortable: °? Change your position from lying down or resting to walking, or change from walking to resting. °? Sit and rest in a tub of warm water. °? Drink enough fluid to keep your urine clear or pale yellow. Dehydration may cause these contractions. °? Do slow and deep breathing several times an hour. °· Keep all follow-up prenatal visits as told by your health care provider. This is important. °Contact a health care provider if: °· You have a  fever. °· You have continuous pain in your abdomen. °Get help right away if: °· Your contractions become stronger, more regular, and closer together. °· You have fluid leaking or gushing from your vagina. °· You pass blood-tinged mucus (bloody show). °· You have bleeding from your vagina. °· You have low back pain that you never had before. °· You feel your baby’s head pushing down and causing pelvic pressure. °· Your baby is not moving inside you as much as it used to. °Summary °· Contractions that occur before labor are called Braxton Hicks contractions, false labor, or practice contractions. °· Braxton Hicks contractions are usually shorter, weaker, farther apart, and less regular than true labor contractions. True labor contractions usually become progressively stronger and regular and they become more frequent. °· Manage discomfort from Braxton Hicks contractions by changing position, resting in a warm bath, drinking plenty of water, or practicing deep breathing. °This information is not intended to replace advice given to you by your health care provider. Make sure you discuss any questions you have with your health care provider. °Document Released: 02/14/2005 Document Revised: 01/04/2016 Document Reviewed: 01/04/2016 °Elsevier Interactive Patient Education © 2017 [employer]. ° °

## 2017-01-19 NOTE — Discharge Instructions (Signed)

## 2017-01-19 NOTE — MAU Note (Signed)
Pt states that she was here this morning, but was sent home. She returns now with ctx every 10 minutes and she describes the pain as unbearable. She rates the ctx 10/10 intermittent. She states the pain is located in her back radiating to lower bottoms like something is trying to come out.  Denies vag bleeding. Denies any leaking of fluid.

## 2017-01-20 ENCOUNTER — Inpatient Hospital Stay (HOSPITAL_COMMUNITY)
Admission: AD | Admit: 2017-01-20 | Discharge: 2017-01-22 | DRG: 806 | Disposition: A | Discharge status: Home / Self Care | Payer: Commercial Managed Care - PPO | Source: Ambulatory Visit | Attending: Obstetrics & Gynecology | Admitting: Obstetrics & Gynecology

## 2017-01-20 ENCOUNTER — Encounter (HOSPITAL_COMMUNITY): Payer: Self-pay | Admitting: Anesthesiology

## 2017-01-20 ENCOUNTER — Inpatient Hospital Stay (HOSPITAL_COMMUNITY): Payer: Commercial Managed Care - PPO | Admitting: Anesthesiology

## 2017-01-20 ENCOUNTER — Encounter (HOSPITAL_COMMUNITY): Payer: Self-pay | Admitting: *Deleted

## 2017-01-20 ENCOUNTER — Other Ambulatory Visit: Payer: Self-pay

## 2017-01-20 DIAGNOSIS — Z3A40 40 weeks gestation of pregnancy: Secondary | ICD-10-CM

## 2017-01-20 DIAGNOSIS — Z3490 Encounter for supervision of normal pregnancy, unspecified, unspecified trimester: Secondary | ICD-10-CM

## 2017-01-20 DIAGNOSIS — E039 Hypothyroidism, unspecified: Secondary | ICD-10-CM | POA: Diagnosis present

## 2017-01-20 DIAGNOSIS — Z3483 Encounter for supervision of other normal pregnancy, third trimester: Secondary | ICD-10-CM | POA: Diagnosis present

## 2017-01-20 DIAGNOSIS — A6000 Herpesviral infection of urogenital system, unspecified: Secondary | ICD-10-CM | POA: Diagnosis present

## 2017-01-20 DIAGNOSIS — O9832 Other infections with a predominantly sexual mode of transmission complicating childbirth: Secondary | ICD-10-CM | POA: Diagnosis present

## 2017-01-20 DIAGNOSIS — O99284 Endocrine, nutritional and metabolic diseases complicating childbirth: Principal | ICD-10-CM | POA: Diagnosis present

## 2017-01-20 LAB — CBC
HCT: 40.1 % (ref 36.0–46.0)
HEMOGLOBIN: 13.8 g/dL (ref 12.0–15.0)
MCH: 30.5 pg (ref 26.0–34.0)
MCHC: 34.4 g/dL (ref 30.0–36.0)
MCV: 88.5 fL (ref 78.0–100.0)
Platelets: 228 10*3/uL (ref 150–400)
RBC: 4.53 MIL/uL (ref 3.87–5.11)
RDW: 13.1 % (ref 11.5–15.5)
WBC: 15.6 10*3/uL — AB (ref 4.0–10.5)

## 2017-01-20 LAB — TYPE AND SCREEN
ABO/RH(D): O POS
ANTIBODY SCREEN: NEGATIVE

## 2017-01-20 LAB — ABO/RH: ABO/RH(D): O POS

## 2017-01-20 LAB — RPR: RPR: NONREACTIVE

## 2017-01-20 MED ORDER — PHENYLEPHRINE 40 MCG/ML (10ML) SYRINGE FOR IV PUSH (FOR BLOOD PRESSURE SUPPORT)
80.0000 ug | PREFILLED_SYRINGE | INTRAVENOUS | Status: DC | PRN
  Filled 2017-01-20: qty 5

## 2017-01-20 MED ORDER — EPHEDRINE 5 MG/ML INJ
10.0000 mg | INTRAVENOUS | Status: DC | PRN
  Filled 2017-01-20: qty 2

## 2017-01-20 MED ORDER — OXYCODONE-ACETAMINOPHEN 5-325 MG PO TABS: 1.0000 | ORAL_TABLET | ORAL | Status: DC | PRN

## 2017-01-20 MED ORDER — FENTANYL 2.5 MCG/ML BUPIVACAINE 1/10 % EPIDURAL INFUSION (WH - ANES)
14.0000 mL/h | INTRAMUSCULAR | Status: DC | PRN
  Administered 2017-01-20 (×2): 14 mL/h via EPIDURAL
  Filled 2017-01-20 (×2): qty 100

## 2017-01-20 MED ORDER — ONDANSETRON HCL 4 MG/2ML IJ SOLN
4.0000 mg | Freq: Four times a day (QID) | INTRAMUSCULAR | Status: DC | PRN
  Administered 2017-01-20: 4 mg via INTRAVENOUS
  Filled 2017-01-20: qty 2

## 2017-01-20 MED ORDER — ONDANSETRON HCL 4 MG/2ML IJ SOLN: 4.0000 mg | INTRAMUSCULAR | Status: DC | PRN

## 2017-01-20 MED ORDER — DIPHENHYDRAMINE HCL 25 MG PO CAPS: 25.0000 mg | ORAL_CAPSULE | Freq: Four times a day (QID) | ORAL | Status: DC | PRN

## 2017-01-20 MED ORDER — LACTATED RINGERS IV SOLN: 500.0000 mL | INTRAVENOUS | Status: DC | PRN

## 2017-01-20 MED ORDER — ZOLPIDEM TARTRATE 5 MG PO TABS: 5.0000 mg | ORAL_TABLET | Freq: Every evening | ORAL | Status: DC | PRN

## 2017-01-20 MED ORDER — SOD CITRATE-CITRIC ACID 500-334 MG/5ML PO SOLN
30.0000 mL | ORAL | Status: DC | PRN
Start: 2017-01-20 — End: 2017-01-20

## 2017-01-20 MED ORDER — DIBUCAINE 1 % RE OINT: 1.0000 "application " | TOPICAL_OINTMENT | RECTAL | Status: DC | PRN

## 2017-01-20 MED ORDER — LIDOCAINE HCL (PF) 1 % IJ SOLN
INTRAMUSCULAR | Status: DC | PRN
  Administered 2017-01-20 (×2): 4 mL

## 2017-01-20 MED ORDER — FENTANYL CITRATE (PF) 100 MCG/2ML IJ SOLN
INTRAMUSCULAR | Status: AC
  Filled 2017-01-20: qty 2

## 2017-01-20 MED ORDER — FENTANYL CITRATE (PF) 100 MCG/2ML IJ SOLN
100.0000 ug | Freq: Once | INTRAMUSCULAR | Status: AC
  Administered 2017-01-20: 100 ug via INTRAVENOUS

## 2017-01-20 MED ORDER — ACETAMINOPHEN 325 MG PO TABS
650.0000 mg | ORAL_TABLET | ORAL | Status: DC | PRN
  Administered 2017-01-21: 650 mg via ORAL
  Filled 2017-01-20: qty 2

## 2017-01-20 MED ORDER — WITCH HAZEL-GLYCERIN EX PADS: 1.0000 "application " | MEDICATED_PAD | CUTANEOUS | Status: DC | PRN

## 2017-01-20 MED ORDER — DIPHENHYDRAMINE HCL 50 MG/ML IJ SOLN: 12.5000 mg | INTRAMUSCULAR | Status: DC | PRN

## 2017-01-20 MED ORDER — LEVOTHYROXINE SODIUM 50 MCG PO TABS
50.0000 ug | ORAL_TABLET | Freq: Every day | ORAL | Status: DC
Start: 2017-01-21 — End: 2017-01-22
  Administered 2017-01-21 – 2017-01-22 (×2): 50 ug via ORAL
  Filled 2017-01-20 (×3): qty 1

## 2017-01-20 MED ORDER — PRENATAL MULTIVITAMIN CH
1.0000 | ORAL_TABLET | Freq: Every day | ORAL | Status: DC
  Administered 2017-01-21 – 2017-01-22 (×2): 1 via ORAL
  Filled 2017-01-20 (×2): qty 1

## 2017-01-20 MED ORDER — ACETAMINOPHEN 325 MG PO TABS: 650.0000 mg | ORAL_TABLET | ORAL | Status: DC | PRN

## 2017-01-20 MED ORDER — OXYTOCIN 40 UNITS IN LACTATED RINGERS INFUSION - SIMPLE MED
2.5000 [IU]/h | INTRAVENOUS | Status: DC
  Filled 2017-01-20: qty 1000

## 2017-01-20 MED ORDER — ACETAMINOPHEN 500 MG PO TABS
1000.0000 mg | ORAL_TABLET | Freq: Four times a day (QID) | ORAL | Status: DC | PRN
Start: 2017-01-20 — End: 2017-01-22
  Administered 2017-01-20: 1000 mg via ORAL
  Filled 2017-01-20: qty 2

## 2017-01-20 MED ORDER — FENTANYL 2.5 MCG/ML BUPIVACAINE 1/10 % EPIDURAL INFUSION (WH - ANES)
14.0000 mL/h | INTRAMUSCULAR | Status: DC | PRN
Start: 2017-01-20 — End: 2017-01-20

## 2017-01-20 MED ORDER — IBUPROFEN 600 MG PO TABS
600.0000 mg | ORAL_TABLET | Freq: Four times a day (QID) | ORAL | Status: DC
  Administered 2017-01-20 – 2017-01-22 (×8): 600 mg via ORAL
  Filled 2017-01-20 (×7): qty 1

## 2017-01-20 MED ORDER — OXYCODONE-ACETAMINOPHEN 5-325 MG PO TABS: 2.0000 | ORAL_TABLET | ORAL | Status: DC | PRN

## 2017-01-20 MED ORDER — FLEET ENEMA 7-19 GM/118ML RE ENEM: 1.0000 | ENEMA | RECTAL | Status: DC | PRN

## 2017-01-20 MED ORDER — SIMETHICONE 80 MG PO CHEW: 80.0000 mg | CHEWABLE_TABLET | ORAL | Status: DC | PRN

## 2017-01-20 MED ORDER — BENZOCAINE-MENTHOL 20-0.5 % EX AERO
1.0000 "application " | INHALATION_SPRAY | CUTANEOUS | Status: DC | PRN
  Filled 2017-01-20: qty 56

## 2017-01-20 MED ORDER — OXYTOCIN 40 UNITS IN LACTATED RINGERS INFUSION - SIMPLE MED
1.0000 m[IU]/min | INTRAVENOUS | Status: DC
  Administered 2017-01-20: 2 m[IU]/min via INTRAVENOUS

## 2017-01-20 MED ORDER — ONDANSETRON HCL 4 MG PO TABS: 4.0000 mg | ORAL_TABLET | ORAL | Status: DC | PRN

## 2017-01-20 MED ORDER — SENNOSIDES-DOCUSATE SODIUM 8.6-50 MG PO TABS
2.0000 | ORAL_TABLET | ORAL | Status: DC
  Administered 2017-01-20 – 2017-01-21 (×2): 2 via ORAL
  Filled 2017-01-20 (×2): qty 2

## 2017-01-20 MED ORDER — LACTATED RINGERS IV SOLN
500.0000 mL | Freq: Once | INTRAVENOUS | Status: AC
  Administered 2017-01-20: 500 mL via INTRAVENOUS

## 2017-01-20 MED ORDER — TERBUTALINE SULFATE 1 MG/ML IJ SOLN
0.2500 mg | Freq: Once | INTRAMUSCULAR | Status: DC | PRN
  Filled 2017-01-20: qty 1

## 2017-01-20 MED ORDER — TETANUS-DIPHTH-ACELL PERTUSSIS 5-2.5-18.5 LF-MCG/0.5 IM SUSP: 0.5000 mL | Freq: Once | INTRAMUSCULAR | Status: DC

## 2017-01-20 MED ORDER — LACTATED RINGERS IV SOLN: INTRAVENOUS | Status: DC

## 2017-01-20 MED ORDER — COCONUT OIL OIL
1.0000 | TOPICAL_OIL | Status: DC | PRN
Start: 2017-01-20 — End: 2017-01-22
  Administered 2017-01-21: 1 via TOPICAL
  Filled 2017-01-20: qty 120

## 2017-01-20 MED ORDER — LIDOCAINE HCL (PF) 1 % IJ SOLN
30.0000 mL | INTRAMUSCULAR | Status: DC | PRN
  Filled 2017-01-20: qty 30

## 2017-01-20 MED ORDER — OXYTOCIN BOLUS FROM INFUSION
500.0000 mL | Freq: Once | INTRAVENOUS | Status: AC
  Administered 2017-01-20: 500 mL via INTRAVENOUS

## 2017-01-20 MED ORDER — PHENYLEPHRINE 40 MCG/ML (10ML) SYRINGE FOR IV PUSH (FOR BLOOD PRESSURE SUPPORT)
80.0000 ug | PREFILLED_SYRINGE | INTRAVENOUS | Status: DC | PRN
  Filled 2017-01-20: qty 10
  Filled 2017-01-20: qty 5

## 2017-01-20 MED ORDER — LACTATED RINGERS IV SOLN
INTRAVENOUS | Status: DC
  Administered 2017-01-20 (×3): via INTRAVENOUS

## 2017-01-20 NOTE — Anesthesia Pain Management Evaluation Note (Signed)
  CRNA Pain Management Visit Note  Patient: Jody SpatzJasmin A Habig, 26 y.o., female  "Hello [facility] am a [facility]. We have an anesthesia team available at all times to provide care throughout the hospital, including epidural management and anesthesia for C-section. I don't know your plan for the delivery whether it a natural birth, water birth, IV sedation, nitrous supplementation, doula or epidural, but we want to meet your pain goals."   1.Was your pain managed to your expectations on prior hospitalizations?   No prior hospitalizations  2.What is your expectation for pain management during this hospitalization?     Epidural  3.How can we help you reach that goal? epidural  Record the patient's initial score and the patient's pain goal.   Pain: 8  Pain Goal: 4 [facility] wants you to be able to say your pain was always managed very well.  Latrell Reitan 01/20/2017

## 2017-01-20 NOTE — MAU Note (Signed)
Pt seen in MAU twice yesterday, DC'd home.  Has continued to have severe contractions, unable to rest.  Uc's are closer together, denies bleeding or LOF.

## 2017-01-20 NOTE — Progress Notes (Signed)
RN attempted to get pt up to bathroom. Pt stating "feeling like passing out" RN placed back in bed. 500cc  IV fluid bolus started at 2040. Pt awake, alert and oriented x4. BP 105/78 pulse 119 Resp 19 and O2 sats at 100%

## 2017-01-20 NOTE — Anesthesia Procedure Notes (Signed)
Epidural Patient location during procedure: OB  Staffing Anesthesiologist: Shameek Nyquist, MD Performed: anesthesiologist   Preanesthetic Checklist Completed: patient identified, pre-op evaluation, timeout performed, IV checked, risks and benefits discussed and monitors and equipment checked  Epidural Patient position: sitting Prep: site prepped and draped and DuraPrep Patient monitoring: heart rate, continuous pulse ox and blood pressure Approach: midline Location: L3-L4 Injection technique: LOR air and LOR saline  Needle:  Needle type: Tuohy  Needle gauge: 17 G Needle length: 9 cm Needle insertion depth: 5 cm Catheter type: closed end flexible Catheter size: 19 Gauge Catheter at skin depth: 10 cm Test dose: negative  Assessment Sensory level: T8 Events: blood not aspirated, injection not painful, no injection resistance, negative IV test and no paresthesia  Additional Notes Reason for block:procedure for pain     

## 2017-01-20 NOTE — H&P (Signed)
Jody Carr is a 26 y.o. female presenting for labor; CTX started 48 hours ago and have increased in intensity and frequency.  No LOF or VB.  Active FM.  Antepartum course complicated by hypothyroidism on Synthroid.  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 GBS negative.    OB History    Gravida Para Term Preterm AB Living   1             SAB TAB Ectopic Multiple Live Births                 Past Medical History:  Diagnosis Date  . Abnormal TSH   . HSV (herpes simplex virus) infection    Past Surgical History:  Procedure Laterality Date  . RHINOPLASTY  02/2016   Family History: family history includes Congestive Heart Failure in her maternal grandmother; Depression in her mother; Diabetes in her paternal grandmother; Hypertension in her maternal grandmother; Stroke in her paternal grandfather. Social History:  reports that  has never smoked. she has never used smokeless tobacco. She reports that she does not drink alcohol or use drugs.     Maternal Diabetes: No Genetic Screening: Normal Maternal Ultrasounds/Referrals: Normal Fetal Ultrasounds or other Referrals:  None Maternal Substance Abuse:  No Significant Maternal Medications:  Meds include: Syntroid Other: Valtrex Significant Maternal Lab Results:  Lab values include: Group B Strep negative Other Comments:  None  ROS Maternal Medical History:  Reason for admission: Contractions.   Contractions: Onset was more than 2 days ago.   Frequency: regular.   Perceived severity is moderate.    Fetal activity: Perceived fetal activity is normal.   Last perceived fetal movement was within the past hour.    Prenatal complications: no prenatal complications Prenatal Complications - Diabetes: none.    Dilation: 5 Effacement (%): 90 Station: -3 Exam by:: Dorrene GermanJ. Lowe RN Blood pressure (!) 144/92, pulse 89, temperature 98.2 F (36.8 C), resp. rate 18, height 5\' 6"  (1.676 m), weight 195 lb (88.5  kg), last menstrual period 04/13/2016. Maternal Exam:  Uterine Assessment: Contraction strength is moderate.  Contraction frequency is regular.   Abdomen: Patient reports no abdominal tenderness. Fundal height is c/w dates.   Estimated fetal weight is 8#.       Physical Exam  Constitutional: She is oriented to person, place, and time. She appears well-developed and well-nourished.  GI: Soft. There is no rebound and no guarding.  Neurological: She is alert and oriented to person, place, and time.  Skin: Skin is warm and dry.  Psychiatric: She has a normal mood and affect. Her behavior is normal.    Prenatal labs: ABO, Rh: --/--/O POS (04/03 2108) Antibody:   Rubella: Immune (04/16 0000) RPR: Nonreactive (04/16 0000)  HBsAg: Negative (04/16 0000)  HIV: Non-reactive (04/16 0000)  GBS: Negative (10/23 0000)   Assessment/Plan: 26yo G1 at 2458w2d with labor -Epidural -Anticipate NSVD   Lamine Laton 01/20/2017, 9:22 AM

## 2017-01-20 NOTE — Anesthesia Preprocedure Evaluation (Signed)
Anesthesia Evaluation  Patient identified by MRN, date of birth, ID band Patient awake    Reviewed: Allergy & Precautions, NPO status , Patient's Chart, lab work & pertinent test results  Airway Mallampati: II  TM Distance: >3 FB Neck ROM: Full    Dental no notable dental hx.    Pulmonary neg pulmonary ROS,    Pulmonary exam normal breath sounds clear to auscultation       Cardiovascular negative cardio ROS Normal cardiovascular exam Rhythm:Regular Rate:Normal     Neuro/Psych negative neurological ROS  negative psych ROS   GI/Hepatic negative GI ROS, Neg liver ROS,   Endo/Other  negative endocrine ROS  Renal/GU negative Renal ROS     Musculoskeletal negative musculoskeletal ROS (+)   Abdominal   Peds  Hematology negative hematology ROS (+)   Anesthesia Other Findings   Reproductive/Obstetrics (+) Pregnancy                             Anesthesia Physical Anesthesia Plan  ASA: II  Anesthesia Plan: Epidural   Post-op Pain Management:    Induction:   PONV Risk Score and Plan:   Airway Management Planned:   Additional Equipment:   Intra-op Plan:   Post-operative Plan:   Informed Consent: I have reviewed the patients History and Physical, chart, labs and discussed the procedure including the risks, benefits and alternatives for the proposed anesthesia with the patient or authorized representative who has indicated his/her understanding and acceptance.       Plan Discussed with:   Anesthesia Plan Comments:         Anesthesia Quick Evaluation  

## 2017-01-20 NOTE — Progress Notes (Signed)
No current c/o.  Comfortable with CLEA.  CVX 9/c/0, arom, clear FHT Cat I Pit 8  Harrah's EntertainmentMegan Coreyon Nicotra, DO

## 2017-01-21 LAB — CBC
HEMATOCRIT: 28.8 % — AB (ref 36.0–46.0)
Hemoglobin: 9.9 g/dL — ABNORMAL LOW (ref 12.0–15.0)
MCH: 31 pg (ref 26.0–34.0)
MCHC: 34.7 g/dL (ref 30.0–36.0)
MCV: 89.2 fL (ref 78.0–100.0)
PLATELETS: 180 10*3/uL (ref 150–400)
RBC: 3.23 MIL/uL — ABNORMAL LOW (ref 3.87–5.11)
RDW: 13.3 % (ref 11.5–15.5)
WBC: 17.3 10*3/uL — AB (ref 4.0–10.5)

## 2017-01-21 NOTE — Anesthesia Postprocedure Evaluation (Signed)
Anesthesia Post Note  Patient: Jody Carr  Procedure(s) Performed: AN AD HOC LABOR EPIDURAL     Patient location during evaluation: Mother Baby Anesthesia Type: Epidural Level of consciousness: awake and alert Pain management: pain level controlled Vital Signs Assessment: post-procedure vital signs reviewed and stable Respiratory status: spontaneous breathing Cardiovascular status: blood pressure returned to baseline Postop Assessment: no headache, patient able to bend at knees, no backache, no apparent nausea or vomiting, epidural receding, adequate PO intake and spinal receding Anesthetic complications: no    Last Vitals:  Vitals:   01/20/17 2346 01/21/17 0520  BP: 119/83 115/69  Pulse: (!) 118 82  Resp: 16 16  Temp: 37.5 C 36.6 C  SpO2:      Last Pain:  Vitals:   01/21/17 0520  TempSrc: Oral  PainSc: 0-No pain   Pain Goal:                 Salome ArntSterling, Yasmin Bronaugh Marie

## 2017-01-21 NOTE — Lactation Note (Signed)
This note was copied from a baby's chart. Lactation Consultation Note: Lactation brochure given with basic breastfeeding teaching done.  Mother has small nipples that are semi-flat. Infant has had 4 feedings. Mother was assist by staff nurse with hand expressing and infant was given 1 ml ebm with a spoon.   Infant placed in cross cradle hold. Mother taught the off sided latch technique. Infant latched with a shallow latch. Parents taught how to flange infants lips for wider gape. Mother reports slight pain on the initial latch. Infant sustained latch for 25 mins. Observed pinched tip of nipple when infant released.   Assist mother with latching infant on the left breast. Left nipple has a tiny crack. Mother reports that latch was comfortable after infants lips flanged. Infant sustained latch for 15 mins.   Discussed the use of a nipple shield if mother continues to have pain with latch. Mother was given shells to wear. Mother taught to firm nipple and hand express colostrum. Advised mother to page for staff assistance if having difficulty with latch. Reviewed cue card and advised cue base feeding. Discussed cluster feeding and advised to feed infant 8-12 times in 24 hours. Mother informed of available LC services. [facility].   Patient Name: Jody Scherrie GerlachJasmin Dacus ZOXWR'UToday's Date: 01/21/2017 Reason for consult: Initial assessment   Maternal Data Has patient been taught Hand Expression?: Yes Does the patient have breastfeeding experience prior to this delivery?: No  Feeding Feeding Type: Breast Fed Length of feed: 15 min  LATCH Score Latch: Grasps breast easily, tongue down, lips flanged, rhythmical sucking.  Audible Swallowing: Spontaneous and intermittent  Type of Nipple: Flat  Comfort (Breast/Nipple): Filling, red/small blisters or bruises, mild/mod discomfort(tiny crack on the left nipple)  Hold (Positioning): Assistance needed to correctly position infant at breast  and maintain latch.  LATCH Score: 7  Interventions Interventions: Breast feeding basics reviewed;Assisted with latch;Skin to skin;Breast massage;Pre-pump if needed;Breast compression;Adjust position;Support pillows;Position options;Shells  Lactation Tools Discussed/Used     Consult Status Consult Status: Follow-up Date: 01/22/17 Follow-up type: In-patient    Stevan BornKendrick, Sevrin Sally [facility] 01/21/2017, 12:22 PM

## 2017-01-21 NOTE — Anesthesia Postprocedure Evaluation (Signed)
Anesthesia Post Note  Patient: Jody Carr  Procedure(s) Performed: AN AD HOC LABOR EPIDURAL     Patient location during evaluation: Mother Baby Anesthesia Type: Epidural Level of consciousness: awake and alert Pain management: pain level controlled Vital Signs Assessment: post-procedure vital signs reviewed and stable Respiratory status: spontaneous breathing Cardiovascular status: blood pressure returned to baseline Postop Assessment: no headache, patient able to bend at knees, no backache, no apparent nausea or vomiting, epidural receding and adequate PO intake Anesthetic complications: no    Last Vitals:  Vitals:   01/20/17 2346 01/21/17 0520  BP: 119/83 115/69  Pulse: (!) 118 82  Resp: 16 16  Temp: 37.5 C 36.6 C  SpO2:      Last Pain:  Vitals:   01/21/17 0520  TempSrc: Oral  PainSc: 0-No pain   Pain Goal:                 Salome ArntSterling, Redonna Wilbert Marie

## 2017-01-21 NOTE — Progress Notes (Signed)
Post Partum Day 1 Subjective: no complaints, up ad lib, voiding and tolerating PO  Objective: Blood pressure 115/69, pulse 82, temperature 97.8 F (36.6 C), temperature source Oral, resp. rate 16, height 5\' 6"  (1.676 m), weight 195 lb (88.5 kg), last menstrual period 04/13/2016, SpO2 99 %, unknown if currently breastfeeding.  Physical Exam:  General: alert, cooperative and appears stated age Lochia: appropriate Uterine Fundus: firm Incision: healing well, no significant drainage, no dehiscence DVT Evaluation: No evidence of DVT seen on physical exam. Negative Homan's sign. No cords or calf tenderness.  Recent Labs    01/20/17 0819 01/21/17 0529  HGB 13.8 9.9*  HCT 40.1 28.8*    Assessment/Plan: Plan for discharge tomorrow and Breastfeeding   LOS: 1 day   Giovanni Bath 01/21/2017, 9:18 AM

## 2017-01-22 MED ORDER — IBUPROFEN 600 MG PO TABS: 600.0000 mg | ORAL_TABLET | Freq: Four times a day (QID) | ORAL | 0 refills | Status: DC

## 2017-01-22 NOTE — Discharge Instructions (Signed)
Call MD for T>100.4, heavy vaginal bleeding, severe abdominal pain, intractable nausea and/or vomiting, or respiratory distress.  Call office to schedule postpartum visit in 6 weeks.  Pelvic rest x 6 weeks.   

## 2017-01-22 NOTE — Lactation Note (Addendum)
This note was copied from a baby's chart. Lactation Consultation Note: Mother has bilateral cracking with some bleeding. Mother reports that latch is painful.  Mother was advised to phone OB and ask for Rx for APNO. Mother was given comfort gels to use until she gets the [facility]. Mother was fit with a #20 nipple shield. Infant latched on and mother untucked infants top lip and did chin tug to get a deeper latch. Advised mother to post pump with hand pump for 15 mins on each breast if she continues to use the nipple shield. Mother to look for milk in the shield. Advised mother in proper application of the shield. Mother to hand express and supplement infant with a spoon after each feeding. Mother reports that her insurance company is giving her an electric pump and pump should come any day.  Mother advised to use APNO until nipples heal and then attempt to latch infant again on the bare breast. Mother was [facility]. Mother reports that she will call office if needed. Mother is aware of all LC services . Mother receptive to all teaching. Instruct mother to continue to cue base feed and to feed infant 8-12 times in 24 hours. Discussed treatment and prevention of engorgement. [facility] with phone number to call if breastfeeding questions or concerns.   Patient Name: Jody Carr GNFAO'ZToday's Date: 01/22/2017 Reason for consult: Follow-up assessment   Maternal Data    Feeding Feeding Type: Breast Fed Length of feed: 10 min  LATCH Score Latch: Grasps breast easily, tongue down, lips flanged, rhythmical sucking.  Audible Swallowing: A few with stimulation  Type of Nipple: Flat(small nipple shaft)  Comfort (Breast/Nipple): Engorged, cracked, bleeding, large blisters, severe discomfort  Hold (Positioning): No assistance needed to correctly position infant at breast.  LATCH Score: 6  Interventions    Lactation Tools  Discussed/Used Tools: Flanges;Nipple Shields(#21 flanges given and harmony hand pump) Nipple shield size: 20   Consult Status Consult Status: Complete    Michel BickersKendrick, Chrishana Spargur McCoy 01/22/2017, 11:59 AM

## 2017-01-22 NOTE — Discharge Summary (Signed)
Obstetric Discharge Summary Reason for Admission: onset of labor Prenatal Procedures: none Intrapartum Procedures: spontaneous vaginal delivery Postpartum Procedures: none Complications-Operative and Postpartum: 2nd degree perineal laceration and right vaginal sulcus laceration Hemoglobin  Date Value Ref Range Status  01/21/2017 9.9 (L) 12.0 - 15.0 g/dL Final    Comment:    DELTA CHECK NOTED REPEATED TO VERIFY    HCT  Date Value Ref Range Status  01/21/2017 28.8 (L) 36.0 - 46.0 % Final    Physical Exam:  General: alert, cooperative and appears stated age 21Lochia: appropriate Uterine Fundus: firm Incision: healing well, no significant drainage, no dehiscence DVT Evaluation: No evidence of DVT seen on physical exam. Negative Homan's sign. No cords or calf tenderness.  Discharge Diagnoses: Term Pregnancy-delivered  Discharge Information: Date: 01/22/2017 Activity: pelvic rest Diet: routine Medications: PNV and Ibuprofen Condition: stable Instructions: refer to practice specific booklet Discharge to: home   Newborn Data: Live born female  Birth Weight: 7 lb 10.4 oz (3470 g) APGAR: 9, 9  Newborn Delivery   Birth date/time:  01/20/2017 19:05:00 Delivery type:  Vaginal, Spontaneous     Home with mother.  Jody Carr 01/22/2017, 9:03 AM

## 2017-01-27 ENCOUNTER — Inpatient Hospital Stay (HOSPITAL_COMMUNITY): Payer: Commercial Managed Care - PPO

## 2017-08-27 IMAGING — US US OB TRANSVAGINAL
1 series · 14 of 28 positions shown · non-contrast
Comparison: None.

CLINICAL DATA: 26 y/o  F; vaginal bleeding.

EXAM:
OBSTETRIC <14 WK US AND TRANSVAGINAL OB US
TECHNIQUE: Both transabdominal and transvaginal ultrasound examinations were
performed for complete evaluation of the gestation as well as the
maternal uterus, adnexal regions, and pelvic cul-de-sac.
Transvaginal technique was performed to assess early pregnancy.

[Series 1: us ob transvaginal · 0.22mm/px · 14 of 39 slices shown]
[im 2/39]
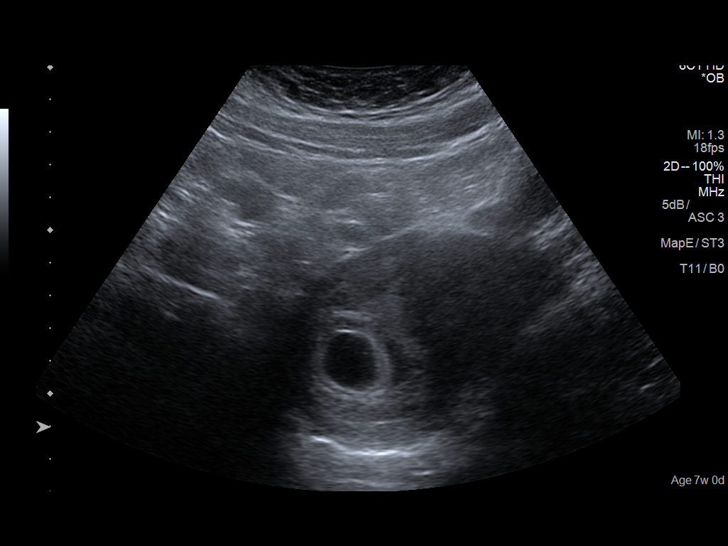
[im 5/39]
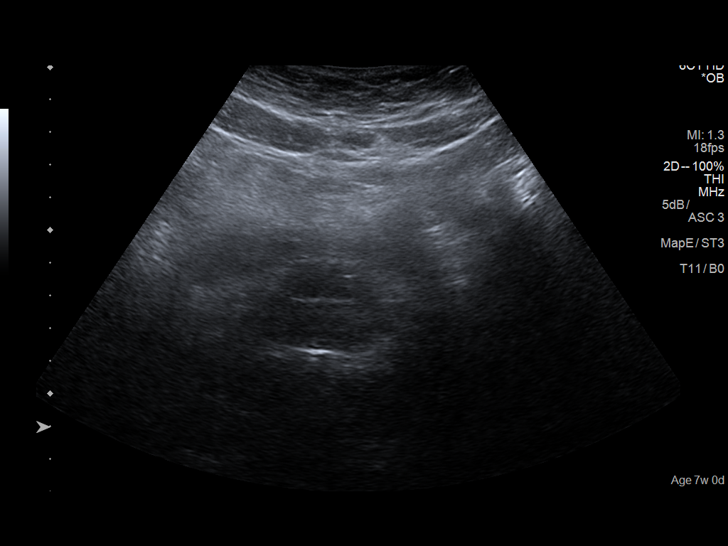
[im 8/39]
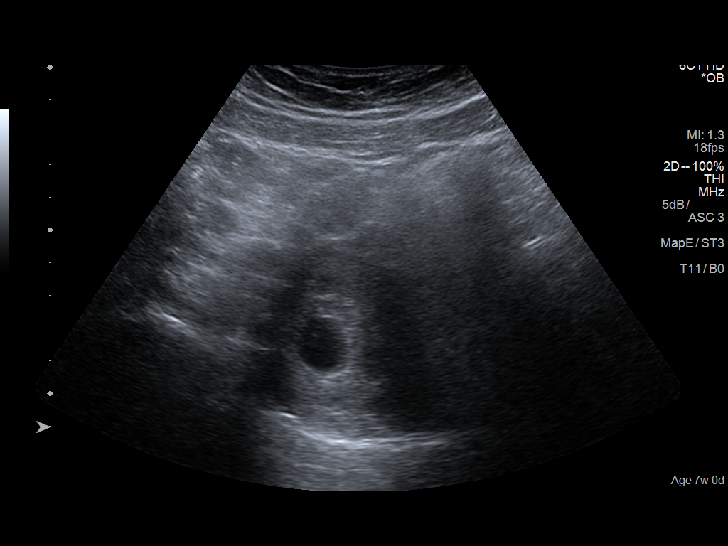
[im 10/39]
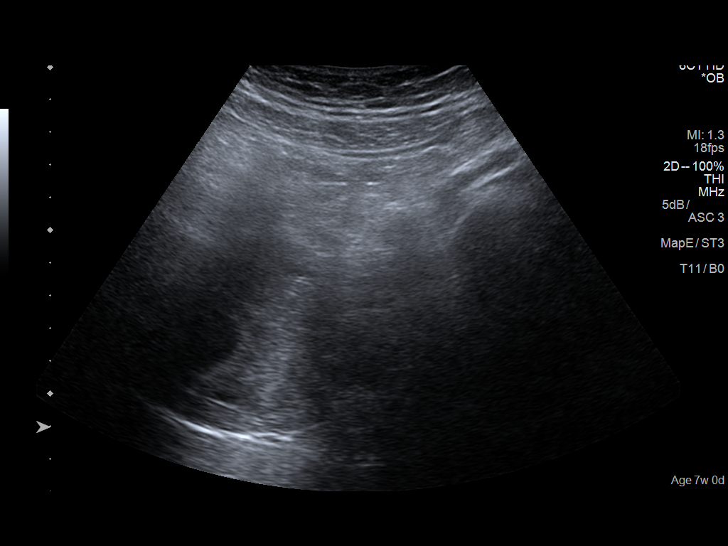
[im 13/39]
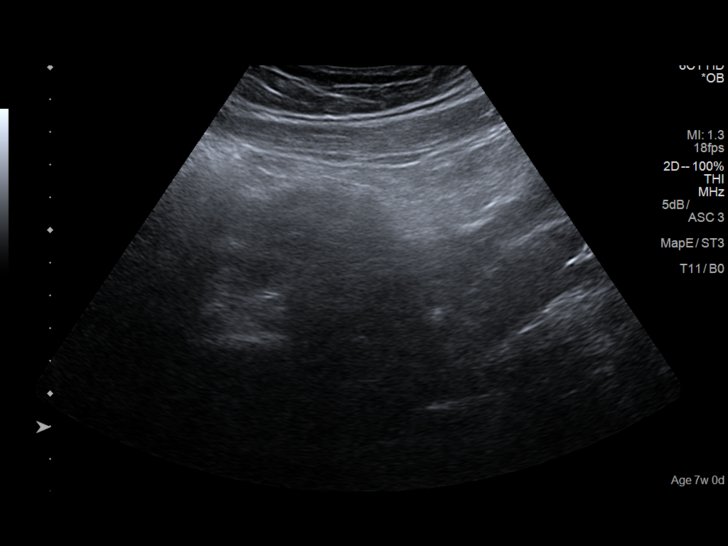
[im 16/39]
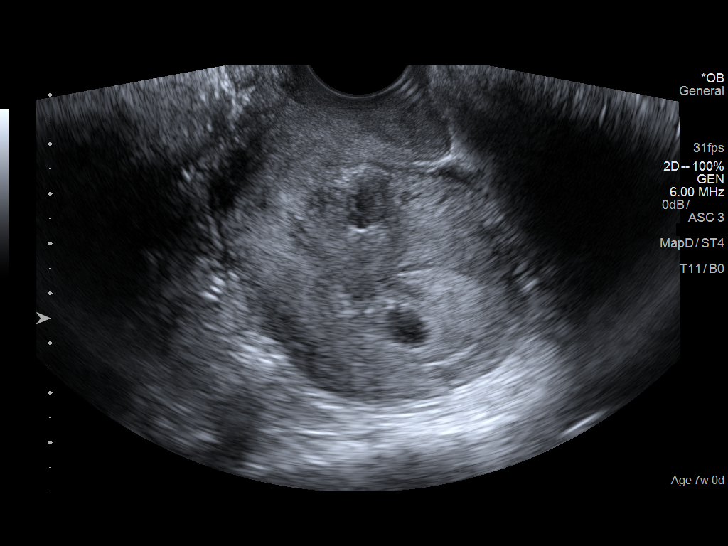
[im 19/39]
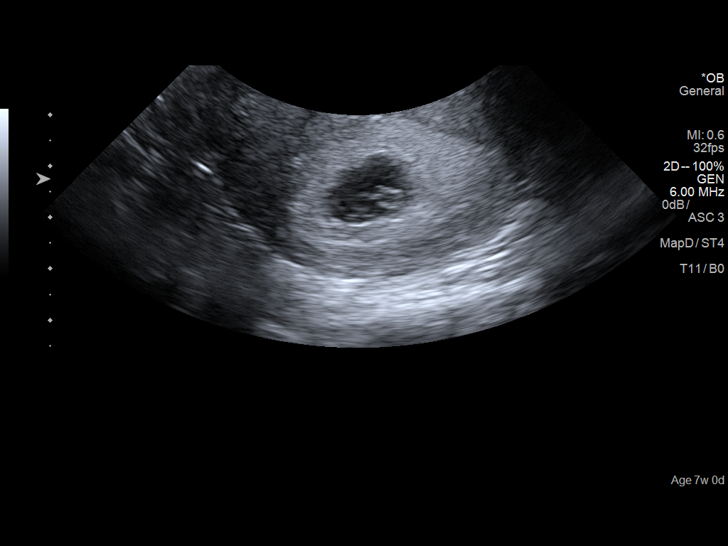
[im 22/39]
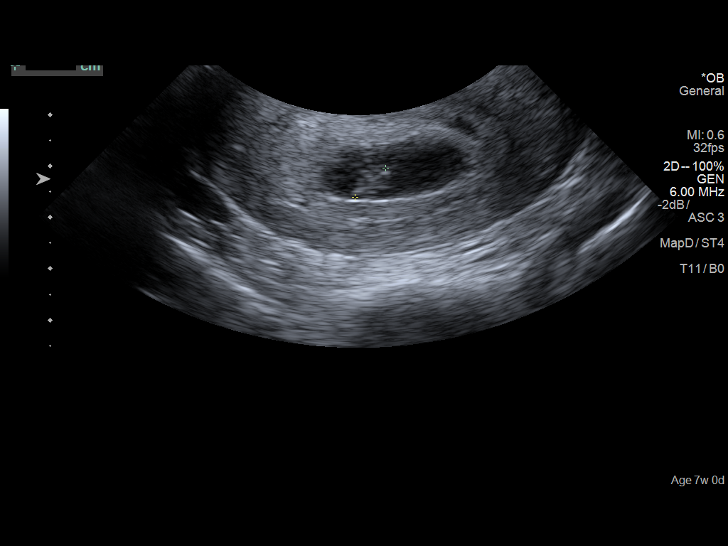
[im 24/39]
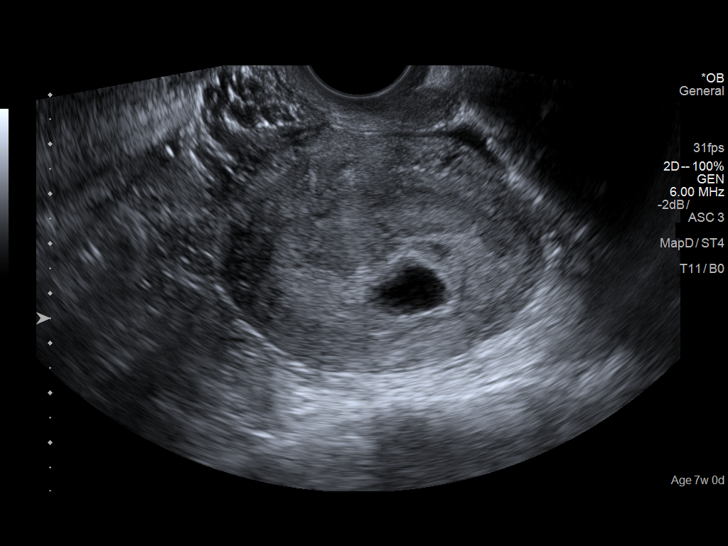
[im 27/39]
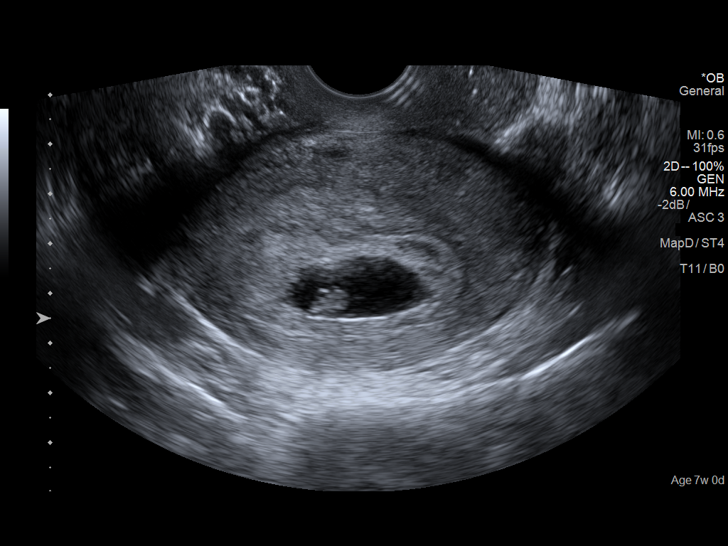
[im 30/39]
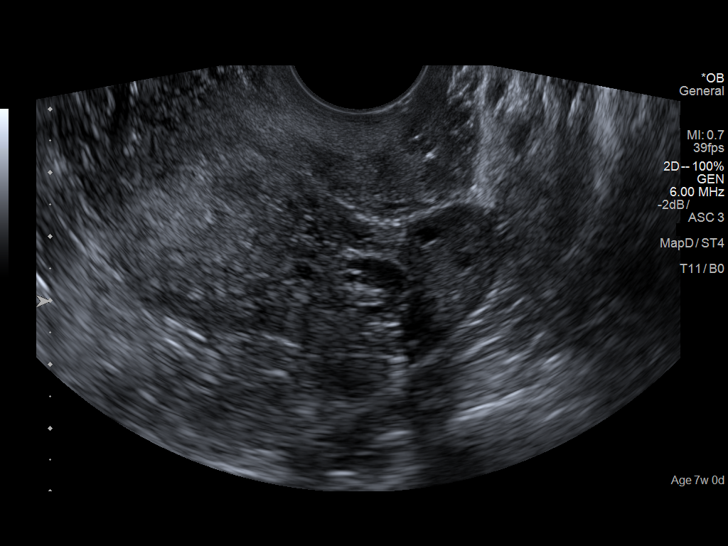
[im 33/39]
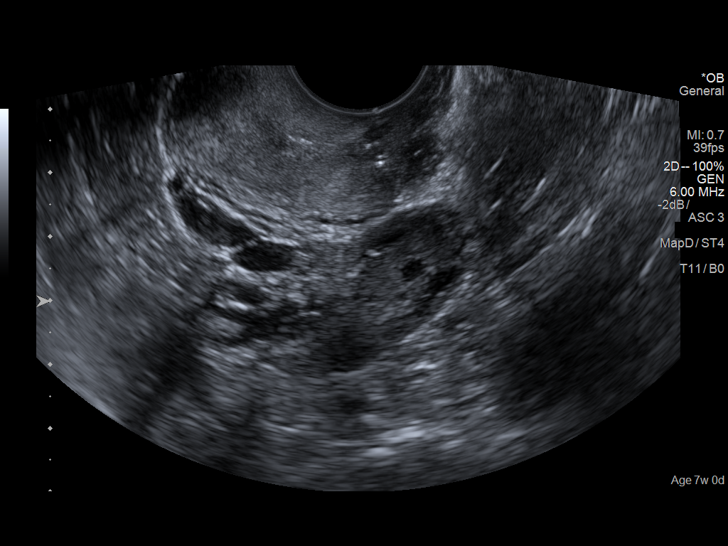
[im 36/39]
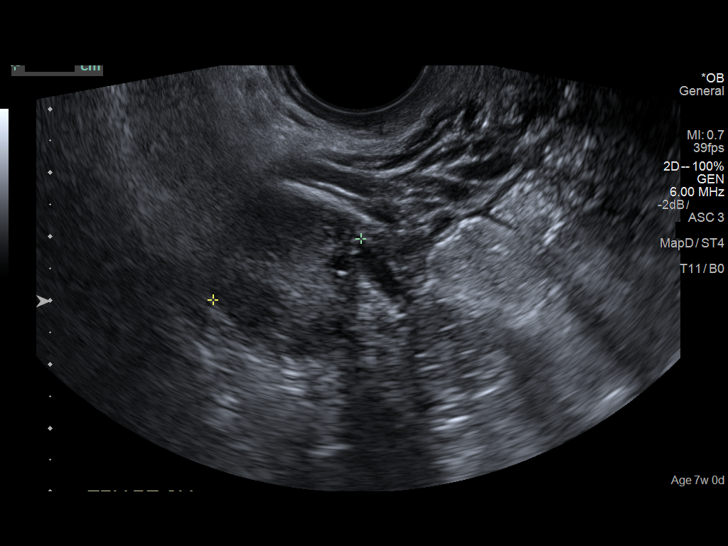
[im 39/39]
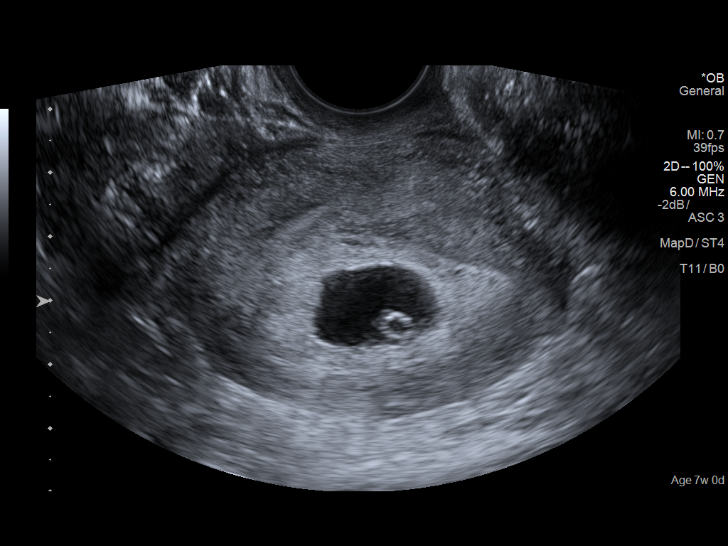

[14 of 28 positions shown; findings below may reference images not displayed]

FINDINGS: Intrauterine gestational sac: Single

Yolk sac:  Visualized.

Embryo:  Visualized.

Cardiac Activity: Visualized.

Heart Rate: 147  bpm

CRL:  8  mm   6 w   5 d                  US EDC: [DATE]

Subchorionic hemorrhage:  Small.

Maternal uterus/adnexae: Lower uterine segment fibroid measuring up
to 1.1 cm. Trace physiologic fluid in the pelvis. Otherwise normal.
IMPRESSION: 1. Single live intrauterine pregnancy with estimated gestational age
of 6 weeks and 5 days.
2. Small subchorionic hemorrhage.
3. Lower uterine segment 1.1 cm fibroid.

By: JHEMBOY M.D.

## 2018-07-30 ENCOUNTER — Other Ambulatory Visit: Payer: Self-pay | Admitting: Obstetrics & Gynecology

## 2018-07-30 DIAGNOSIS — N644 Mastodynia: Secondary | ICD-10-CM

## 2018-08-03 ENCOUNTER — Other Ambulatory Visit: Payer: Commercial Managed Care - PPO

## 2018-08-20 ENCOUNTER — Other Ambulatory Visit: Payer: Self-pay

## 2018-08-20 ENCOUNTER — Ambulatory Visit
Admission: RE | Admit: 2018-08-20 | Discharge: 2018-08-20 | Disposition: A | Discharge status: Home / Self Care | Payer: Self-pay | Source: Ambulatory Visit | Attending: Obstetrics & Gynecology | Admitting: Obstetrics & Gynecology

## 2018-08-20 DIAGNOSIS — N644 Mastodynia: Secondary | ICD-10-CM

## 2018-08-20 IMAGING — US ULTRASOUND LEFT BREAST LIMITED
1 series · 3 of 3 positions shown · non-contrast
Comparison: None

CLINICAL DATA: Patient presents for focal tenderness within the
upper-outer left breast.

EXAM:
ULTRASOUND OF THE LEFT BREAST

[Series 1: ultrasound left breast limited · 0.07mm/px · 3 of 3 slices shown]
[im 1/3]
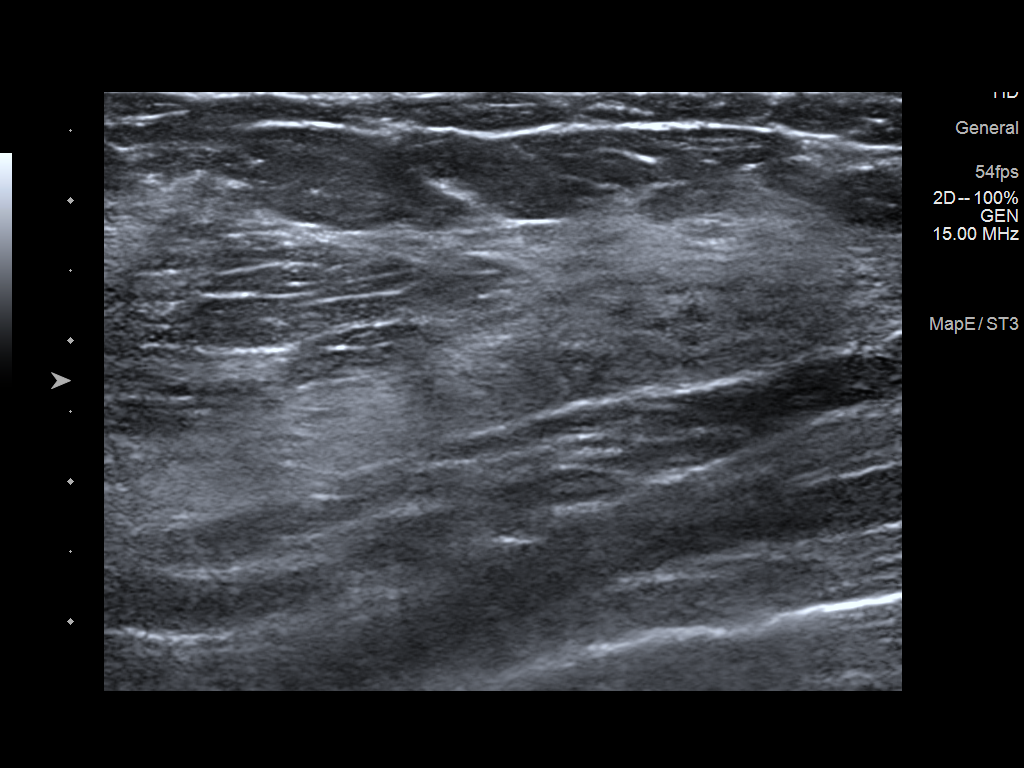
[im 2/3]
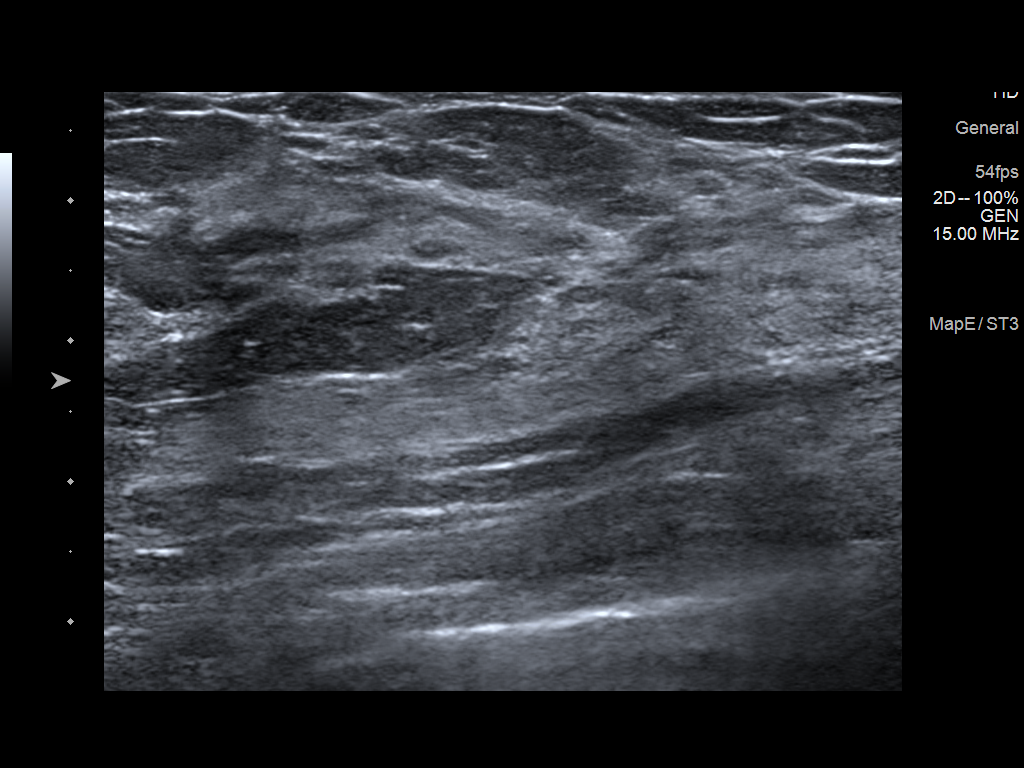
[im 3/3]
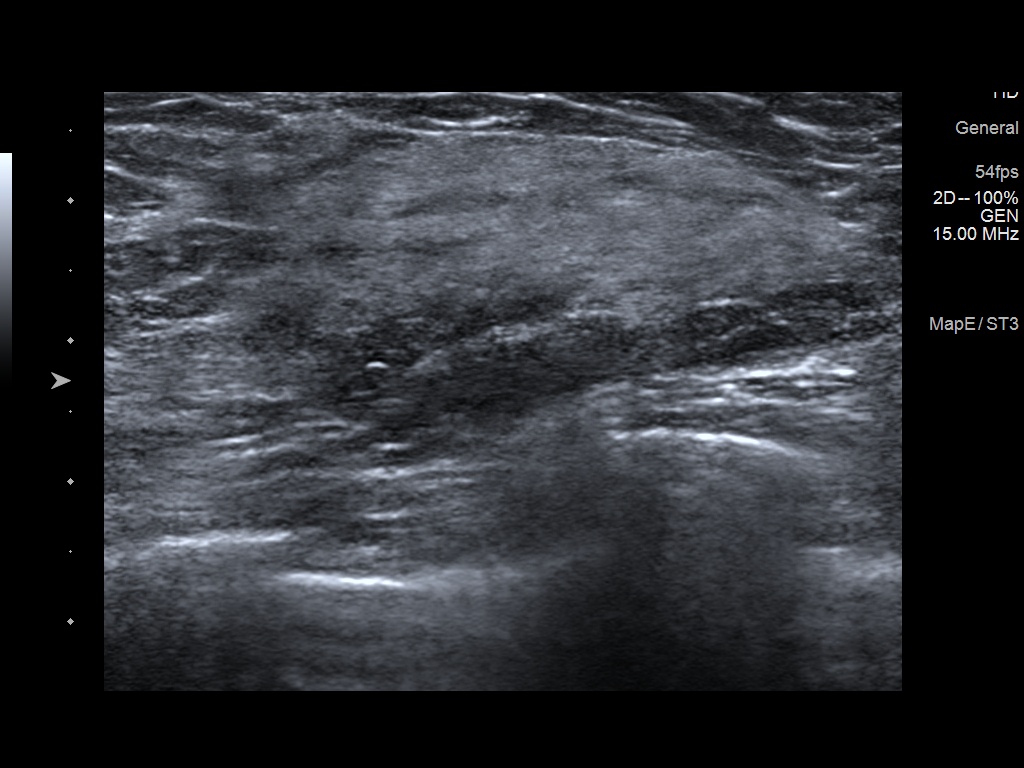

[3 of 3 positions shown; findings below may reference images not displayed]

FINDINGS: On physical exam, no mass is palpated within the upper-outer left
breast.

Targeted ultrasound is performed, showing normal dense tissue
without suspicious mass within the upper-outer left breast.
IMPRESSION: No sonographic evidence for malignancy.

RECOMMENDATION:
Continued clinical evaluation for left breast tenderness.

Screening mammogram at age 40 unless there are persistent or
intervening clinical concerns. (Code:[O8])

I have discussed the findings and recommendations with the patient.
Results were also provided in writing at the conclusion of the
visit. If applicable, a reminder letter will be sent to the patient
regarding the next appointment.

BI-RADS CATEGORY  1: Negative.

## 2021-01-01 LAB — OB RESULTS CONSOLE ANTIBODY SCREEN: Antibody Screen: NEGATIVE

## 2021-01-01 LAB — OB RESULTS CONSOLE ABO/RH: RH Type: POSITIVE

## 2021-01-01 LAB — OB RESULTS CONSOLE HEPATITIS B SURFACE ANTIGEN: Hepatitis B Surface Ag: NEGATIVE

## 2021-01-01 LAB — OB RESULTS CONSOLE HIV ANTIBODY (ROUTINE TESTING): HIV: NONREACTIVE

## 2021-01-01 LAB — HEPATITIS C ANTIBODY: HCV Ab: NEGATIVE

## 2021-01-01 LAB — OB RESULTS CONSOLE RPR: RPR: NONREACTIVE

## 2021-01-01 LAB — OB RESULTS CONSOLE RUBELLA ANTIBODY, IGM: Rubella: IMMUNE

## 2021-01-25 LAB — OB RESULTS CONSOLE GC/CHLAMYDIA
Chlamydia: NEGATIVE
Neisseria Gonorrhea: NEGATIVE

## 2021-02-28 NOTE — L&D Delivery Note (Signed)
Delivery Note At 10:19 AM a viable female was delivered via Vaginal, Spontaneous (Presentation: Right Occiput Anterior).  APGAR: 8, 9; weight  .   Placenta status: Spontaneous, Intact.  Cord: 3 vessels with the following complications: Short.   Anesthesia: Epidural Episiotomy: None Lacerations:  right sulcus, 2nd degree perineal. Sphincter intact Suture Repair: 3.0 vicryl Est. Blood Loss (mL): 200 cc   Mom to postpartum.  Baby to Couplet care / Skin to Skin.  Lyn Henri 08/11/2021, 10:40 AM

## 2021-05-23 ENCOUNTER — Inpatient Hospital Stay (HOSPITAL_COMMUNITY): Payer: BC Managed Care – PPO

## 2021-05-23 ENCOUNTER — Inpatient Hospital Stay (HOSPITAL_COMMUNITY)
Admission: AD | Admit: 2021-05-23 | Discharge: 2021-05-24 | Disposition: A | Discharge status: Home / Self Care | Payer: BC Managed Care – PPO | Attending: Obstetrics and Gynecology | Admitting: Obstetrics and Gynecology

## 2021-05-23 ENCOUNTER — Encounter (HOSPITAL_COMMUNITY): Payer: Self-pay | Admitting: Obstetrics and Gynecology

## 2021-05-23 ENCOUNTER — Other Ambulatory Visit: Payer: Self-pay

## 2021-05-23 DIAGNOSIS — D72829 Elevated white blood cell count, unspecified: Secondary | ICD-10-CM | POA: Insufficient documentation

## 2021-05-23 DIAGNOSIS — Z3A29 29 weeks gestation of pregnancy: Secondary | ICD-10-CM | POA: Diagnosis not present

## 2021-05-23 DIAGNOSIS — O26899 Other specified pregnancy related conditions, unspecified trimester: Secondary | ICD-10-CM | POA: Diagnosis not present

## 2021-05-23 DIAGNOSIS — O99113 Other diseases of the blood and blood-forming organs and certain disorders involving the immune mechanism complicating pregnancy, third trimester: Secondary | ICD-10-CM | POA: Diagnosis not present

## 2021-05-23 DIAGNOSIS — O99283 Endocrine, nutritional and metabolic diseases complicating pregnancy, third trimester: Secondary | ICD-10-CM | POA: Diagnosis not present

## 2021-05-23 DIAGNOSIS — M545 Low back pain, unspecified: Secondary | ICD-10-CM | POA: Diagnosis not present

## 2021-05-23 DIAGNOSIS — Z3689 Encounter for other specified antenatal screening: Secondary | ICD-10-CM

## 2021-05-23 DIAGNOSIS — R109 Unspecified abdominal pain: Secondary | ICD-10-CM | POA: Insufficient documentation

## 2021-05-23 DIAGNOSIS — E039 Hypothyroidism, unspecified: Secondary | ICD-10-CM | POA: Insufficient documentation

## 2021-05-23 HISTORY — DX: Hypothyroidism, unspecified: E03.9

## 2021-05-23 LAB — CBC WITH DIFFERENTIAL/PLATELET
Abs Immature Granulocytes: 0.02 10*3/uL (ref 0.00–0.07)
Basophils Absolute: 0 10*3/uL (ref 0.0–0.1)
Basophils Relative: 0 %
Eosinophils Absolute: 0.1 10*3/uL (ref 0.0–0.5)
Eosinophils Relative: 0 %
HCT: 33.2 % — ABNORMAL LOW (ref 36.0–46.0)
Hemoglobin: 11.5 g/dL — ABNORMAL LOW (ref 12.0–15.0)
Immature Granulocytes: 0 %
Lymphocytes Relative: 14 %
Lymphs Abs: 1.5 10*3/uL (ref 0.7–4.0)
MCH: 29.3 pg (ref 26.0–34.0)
MCHC: 34.6 g/dL (ref 30.0–36.0)
MCV: 84.7 fL (ref 80.0–100.0)
Monocytes Absolute: 0.5 10*3/uL (ref 0.1–1.0)
Monocytes Relative: 5 %
Neutro Abs: 9 10*3/uL — ABNORMAL HIGH (ref 1.7–7.7)
Neutrophils Relative %: 81 %
Platelets: 250 10*3/uL (ref 150–400)
RBC: 3.92 MIL/uL (ref 3.87–5.11)
RDW: 12.9 % (ref 11.5–15.5)
WBC: 11.2 10*3/uL — ABNORMAL HIGH (ref 4.0–10.5)
nRBC: 0 % (ref 0.0–0.2)

## 2021-05-23 LAB — URINALYSIS, ROUTINE W REFLEX MICROSCOPIC
Bilirubin Urine: NEGATIVE
Glucose, UA: NEGATIVE mg/dL
Hgb urine dipstick: NEGATIVE
Ketones, ur: NEGATIVE mg/dL
Leukocytes,Ua: NEGATIVE
Nitrite: NEGATIVE
Protein, ur: NEGATIVE mg/dL
Specific Gravity, Urine: 1.002 — ABNORMAL LOW (ref 1.005–1.030)
pH: 7 (ref 5.0–8.0)

## 2021-05-23 LAB — COMPREHENSIVE METABOLIC PANEL
ALT: 17 U/L (ref 0–44)
AST: 20 U/L (ref 15–41)
Albumin: 2.9 g/dL — ABNORMAL LOW (ref 3.5–5.0)
Alkaline Phosphatase: 70 U/L (ref 38–126)
Anion gap: 8 (ref 5–15)
BUN: 6 mg/dL (ref 6–20)
CO2: 23 mmol/L (ref 22–32)
Calcium: 8.3 mg/dL — ABNORMAL LOW (ref 8.9–10.3)
Chloride: 103 mmol/L (ref 98–111)
Creatinine, Ser: 0.49 mg/dL (ref 0.44–1.00)
GFR, Estimated: >60 mL/min (ref >60)
Glucose, Bld: 97 mg/dL (ref 70–99)
Potassium: 3.5 mmol/L (ref 3.5–5.1)
Sodium: 134 mmol/L — ABNORMAL LOW (ref 135–145)
Total Bilirubin: 0.2 mg/dL — ABNORMAL LOW (ref 0.3–1.2)
Total Protein: 6.1 g/dL — ABNORMAL LOW (ref 6.5–8.1)

## 2021-05-23 IMAGING — MR MR ABDOMEN W/O CM
17 of 18 series · 39 of 48 positions shown · non-contrast
Comparison: None.

CLINICAL DATA: Right lower quadrant pain, history of 29 week
pregnancy, initial encounter

EXAM:
MRI ABDOMEN AND PELVIS WITHOUT CONTRAST
TECHNIQUE: Multiplanar multisequence MR imaging was performed without the
administration of intravenous contrast.

[Series 3: cor haste · coronal · 5.0mm · 1.04mm/px · 3 of 35 slices shown]
[im 1/35]
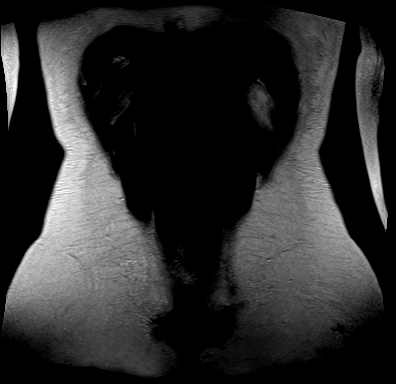
[im 18/35]
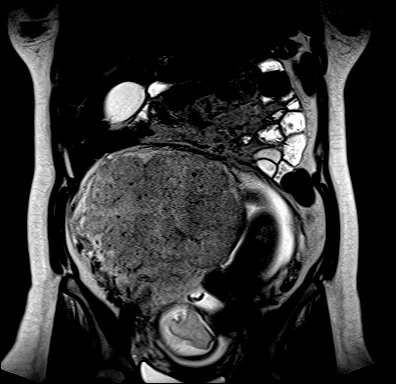
[im 35/35]
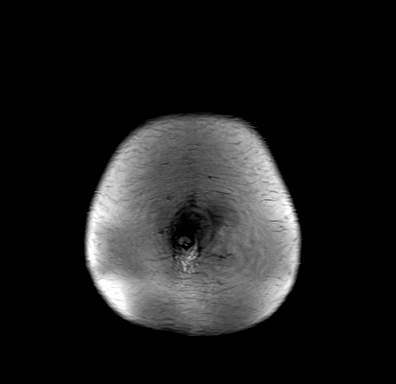

[Series 5: cor haste fs · coronal · 5.0mm · 1.04mm/px · 2 of 26 slices shown (1 of 2)]
[im 1/26]
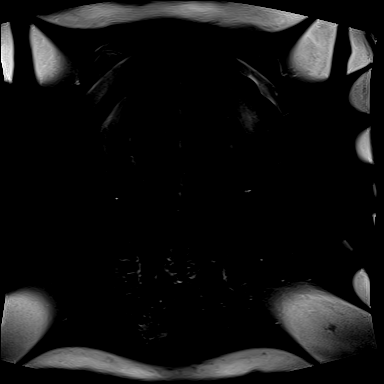
[im 26/26]
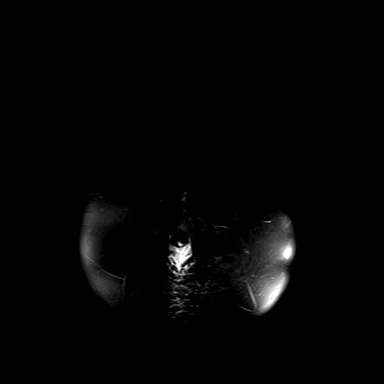

[Series 7: bSSFP · coronal · 5.0mm · 1.61mm/px · 2 of 35 slices shown (1 of 3)]
[im 1/35]
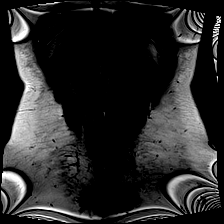
[im 35/35]
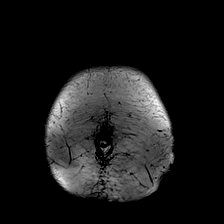

[Series 10: cor haste fs · coronal · 5.0mm · 1.04mm/px · 2 of 35 slices shown (2 of 2)]
[im 1/35]
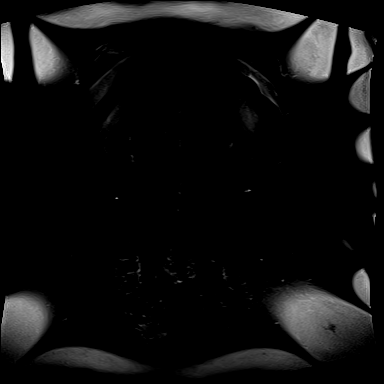
[im 35/35]
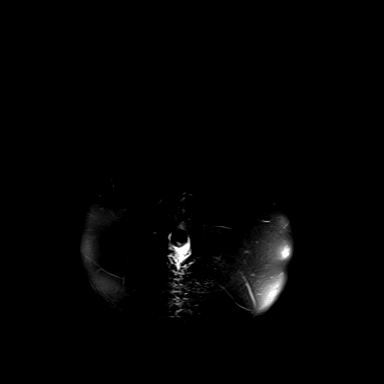

[Series 15: ax haste_comp · axial · 5.0mm · 1.19mm/px · z∈[-138,+66]mm · 2 of 35 slices shown (1 of 2)]
[im 1/35]
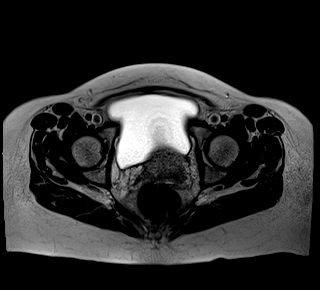
[im 35/35]
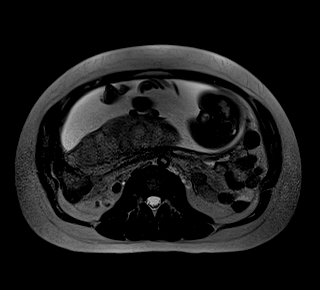

[Series 15: ax haste_comp · axial · 5.0mm · 0.99mm/px · z∈[+69,+261]mm · 2 of 33 slices shown (2 of 2)]
[im 1/33]
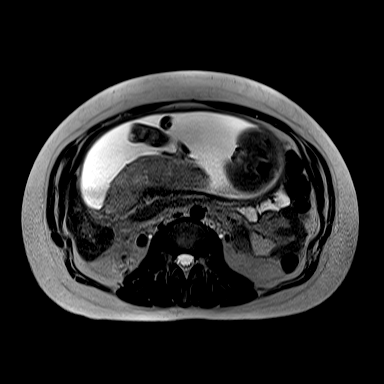
[im 33/33]
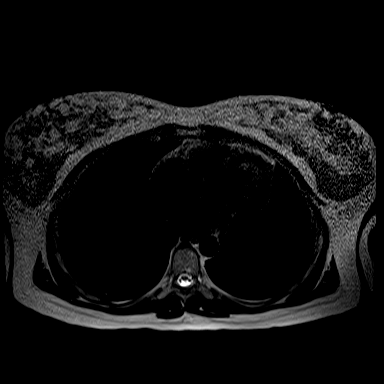

[Series 20: ax haste fs_comp · axial · 5.0mm · 1.19mm/px · z∈[-138,+66]mm · 2 of 35 slices shown (1 of 2)]
[im 1/35]
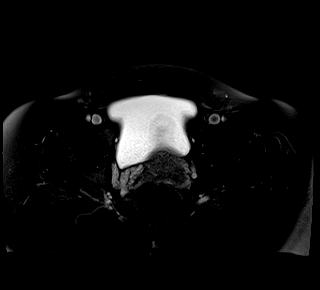
[im 35/35]
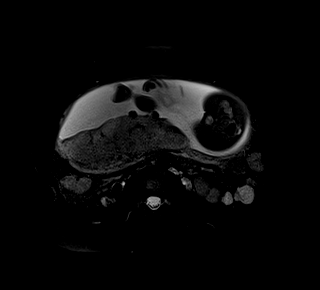

[Series 20: ax haste fs_comp · axial · 5.0mm · 0.99mm/px · z∈[+69,+261]mm · 2 of 33 slices shown (2 of 2)]
[im 1/33]
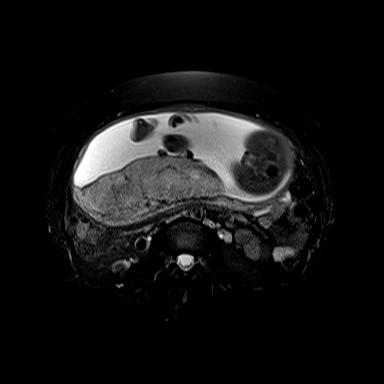
[im 33/33]
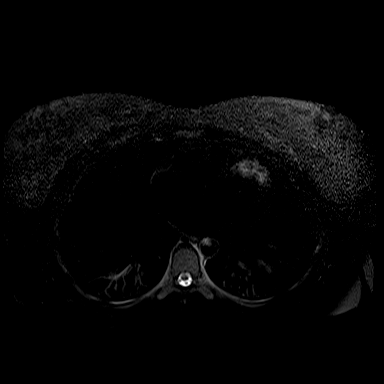

[Series 23: T2 fat-sat · axial · 5.0mm · 1.19mm/px · z∈[+69,+261]mm · 2 of 33 slices shown (1 of 3)]
[im 1/33]
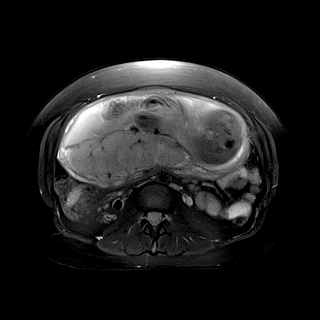
[im 33/33]
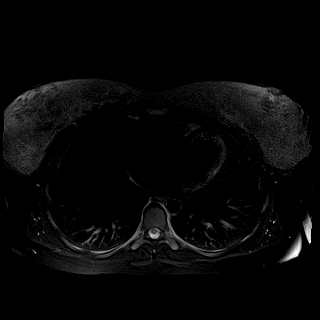

[Series 26: T2 fat-sat · axial · 5.0mm · 1.19mm/px · 1 of 2 slices shown (2 of 3)]
[im 1/2]
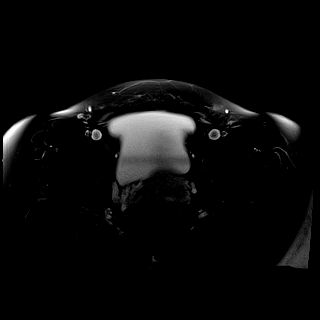

[Series 29: bSSFP · axial · 5.0mm · 1.61mm/px · z∈[+69,+261]mm · 2 of 33 slices shown (2 of 3)]
[im 1/33]
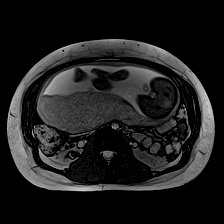
[im 33/33]
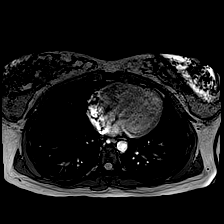

[Series 29: bSSFP · axial · 5.0mm · 0.74mm/px · z∈[-138,+66]mm · 2 of 35 slices shown (3 of 3)]
[im 1/35]
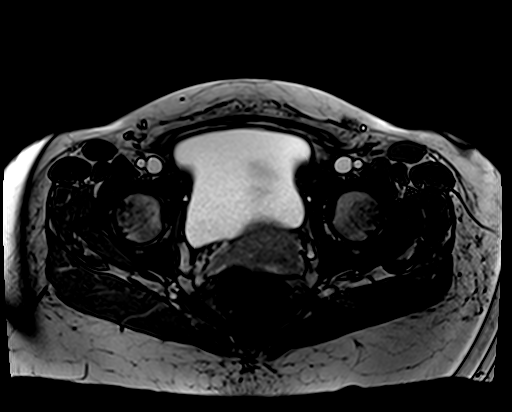
[im 35/35]
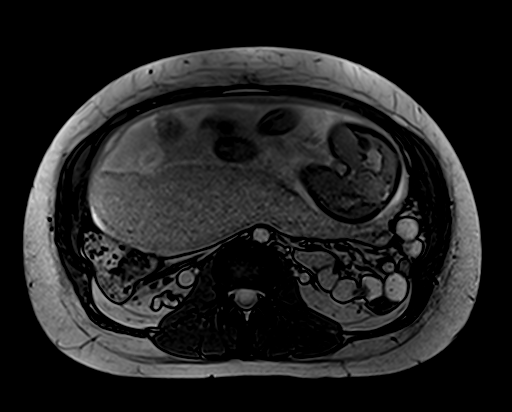

[Series 32: T2 fat-sat · axial · 5.0mm · 1.19mm/px · z∈[+69,+261]mm · 2 of 23 slices shown (3 of 3)]
[im 1/23]
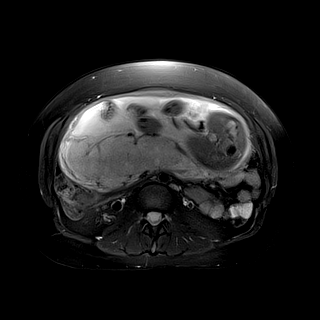
[im 23/23]
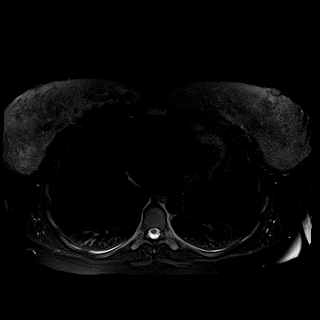

[Series 35: T1 · axial · 3.0mm · 0.70mm/px · z∈[+67,+208]mm · 3 of 40 slices shown (1 of 2)]
[im 1/40]
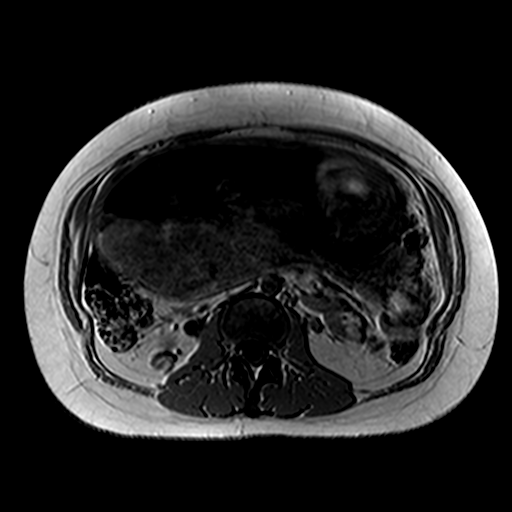
[im 20/40]
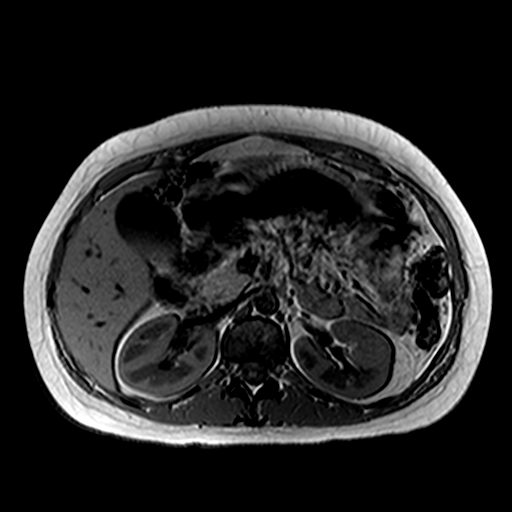
[im 40/40]
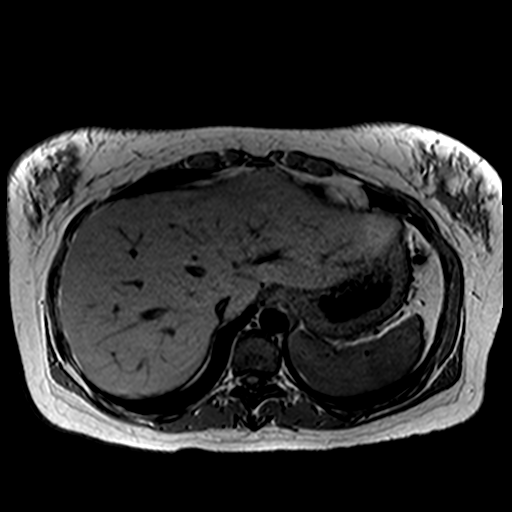

[Series 35: T1 · axial · 6.0mm · 1.48mm/px · z∈[-143,+66]mm · 2 of 30 slices shown (2 of 2)]
[im 1/30]
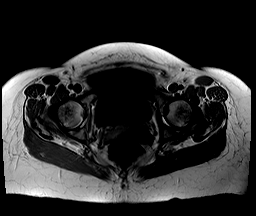
[im 30/30]
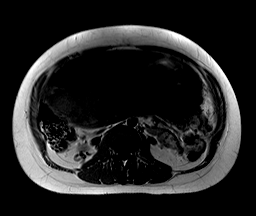

[Series 36: T1 dynamic · axial · 3.0mm · 1.41mm/px · z∈[+8,+245]mm · 6 of 80 slices shown (1 of 2)]
[im 1/80]
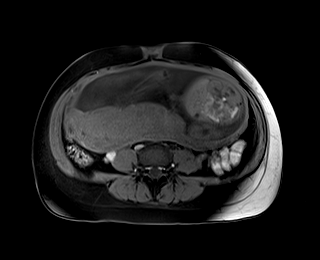
[im 16/80]
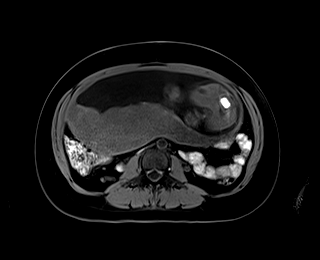
[im 32/80]
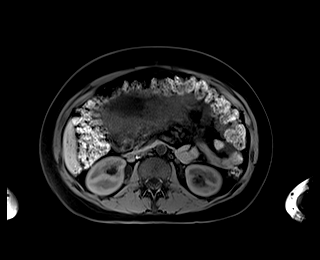
[im 48/80]
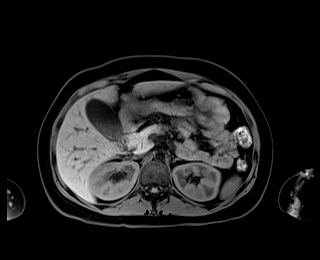
[im 64/80]
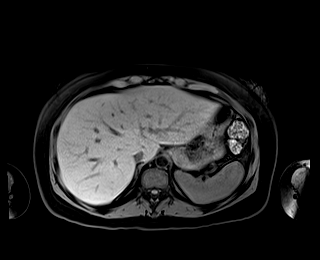
[im 80/80]
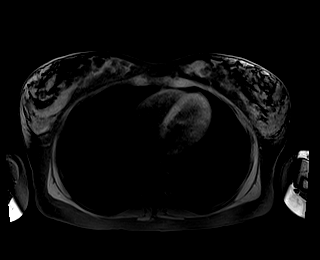

[Series 37: T1 dynamic · axial · 3.0mm · 1.41mm/px · z∈[-225,-180]mm · 2 of 80 slices shown (2 of 2)]
[im 1/80]
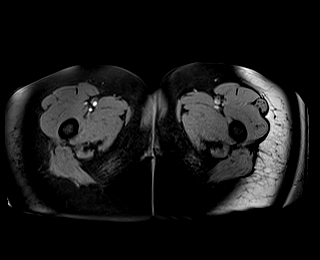
[im 16/80]
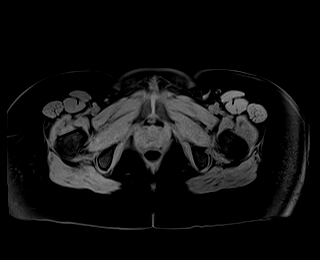

[39 of 48 positions shown; findings below may reference images not displayed]

FINDINGS: Lower chest: No sizable effusion is noted.

Hepatobiliary: Liver shows no focal mass lesion. Gallbladder is well
distended. No biliary ductal dilatation is seen. No evidence of
cholelithiasis or choledocholithiasis is noted.

Pancreas: No mass, inflammatory changes, or other parenchymal
abnormality identified.

Spleen:  Within normal limits in size and appearance.

Adrenals/Urinary Tract: Adrenal glands are within normal limits.
Kidneys are well visualized bilaterally. Mild fullness of the
collecting systems is noted bilaterally right greater than left
consistent with ureteral compression by the gravid uterus. This may
contribute to the patient's underlying discomfort.

Stomach/Bowel: The appendix is well visualized and within normal
limits. Large and small bowel are displaced by the gravid uterus but
otherwise within normal limits. Visualized stomach is decompressed.

Vascular/Lymphatic: No pathologically enlarged lymph nodes
identified. No abdominal aortic aneurysm demonstrated.

Other: Gravid uterus is seen with single intrauterine gestation
consistent with the given clinical history. The placenta is
posterior in nature. In the right ovary there is a 2.8 cm structure
identified which does not follow simple fluid characteristics and
may represent a small hemorrhagic cyst in the right ovary. No free
fluid is noted.

Musculoskeletal: No acute bony abnormality is noted.
IMPRESSION: Normal-appearing appendix.

Small 2.8 cm structure within the right ovary suspicious for
hemorrhagic cyst.

Gravid uterus consistent with the given clinical history.

Some associated mild hydronephrosis is noted bilaterally right
greater than left likely related to compression from the gravid
uterus. This could contribute to the patient's underlying
discomfort.

## 2021-05-23 IMAGING — MR MR PELVIS W/O CM
17 of 18 series · 39 of 48 positions shown · non-contrast
Comparison: None.

CLINICAL DATA: Right lower quadrant pain, history of 29 week
pregnancy, initial encounter

EXAM:
MRI ABDOMEN AND PELVIS WITHOUT CONTRAST
TECHNIQUE: Multiplanar multisequence MR imaging was performed without the
administration of intravenous contrast.

[Series 3: cor haste · coronal · 5.0mm · 1.04mm/px · 3 of 35 slices shown]
[im 1/35]
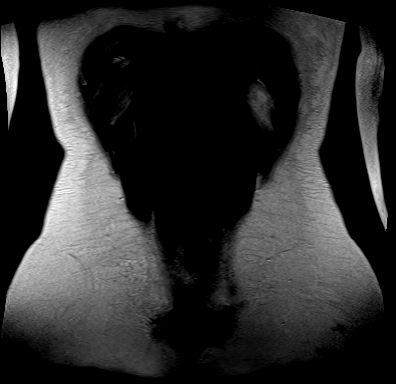
[im 18/35]
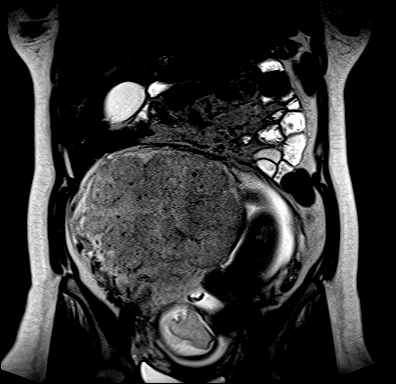
[im 35/35]
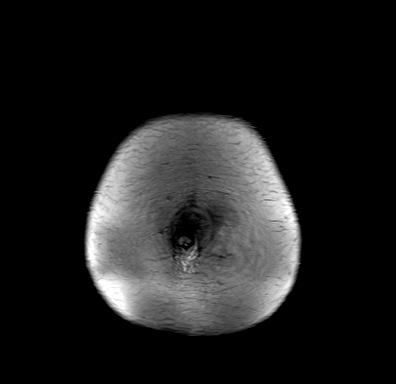

[Series 4: cor haste fs · coronal · 5.0mm · 1.04mm/px · 2 of 26 slices shown (1 of 2)]
[im 1/26]
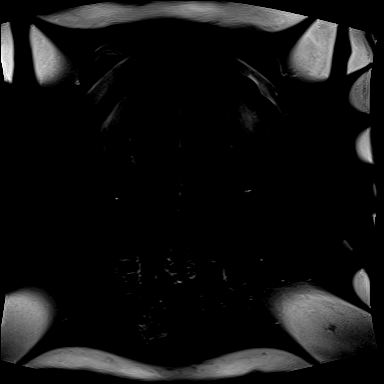
[im 26/26]
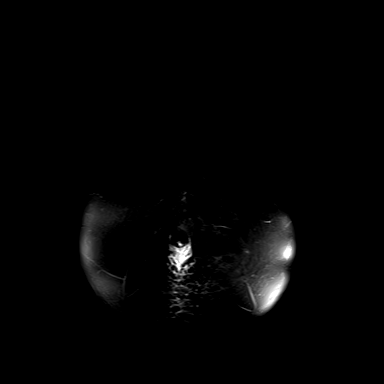

[Series 7: bSSFP · coronal · 5.0mm · 1.61mm/px · 2 of 35 slices shown (1 of 3)]
[im 1/35]
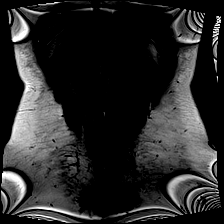
[im 35/35]
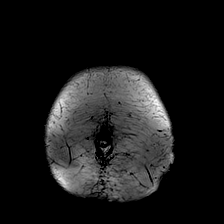

[Series 10: cor haste fs · coronal · 5.0mm · 1.04mm/px · 2 of 35 slices shown (2 of 2)]
[im 1/35]
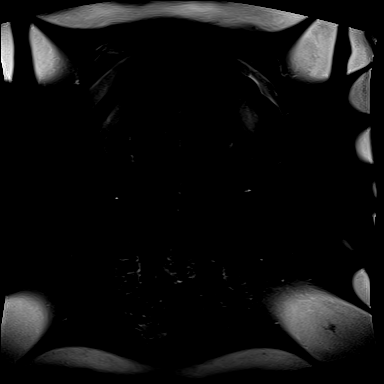
[im 35/35]
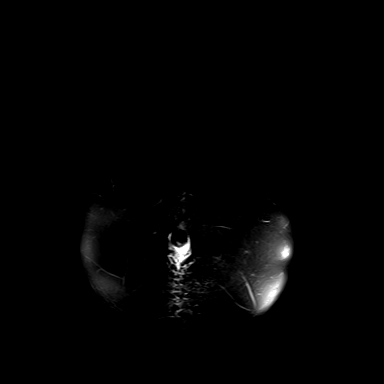

[Series 15: ax haste_comp · axial · 5.0mm · 0.99mm/px · z∈[+69,+261]mm · 2 of 33 slices shown (1 of 2)]
[im 1/33]
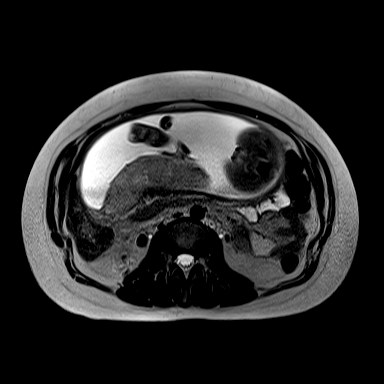
[im 33/33]
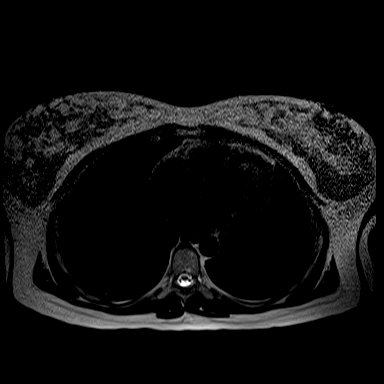

[Series 15: ax haste_comp · axial · 5.0mm · 1.19mm/px · z∈[-138,+66]mm · 2 of 35 slices shown (2 of 2)]
[im 1/35]
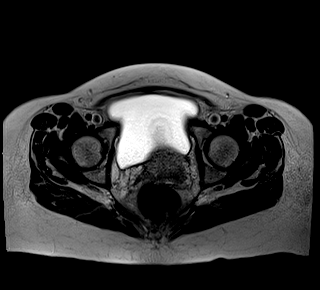
[im 35/35]
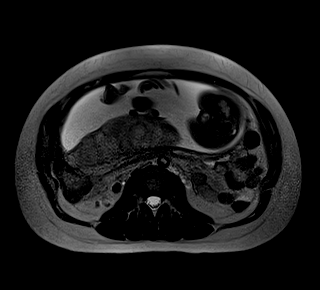

[Series 20: ax haste fs_comp · axial · 5.0mm · 1.19mm/px · z∈[-138,+66]mm · 2 of 35 slices shown (1 of 2)]
[im 1/35]
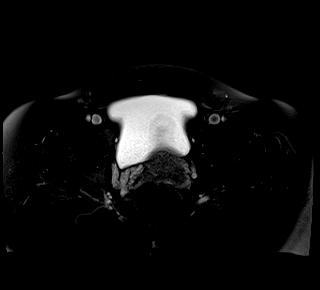
[im 35/35]
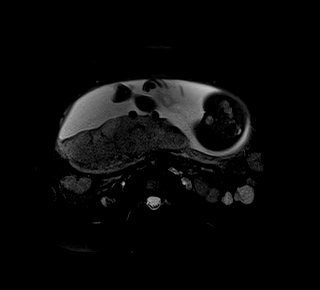

[Series 20: ax haste fs_comp · axial · 5.0mm · 0.99mm/px · z∈[+69,+261]mm · 2 of 33 slices shown (2 of 2)]
[im 1/33]
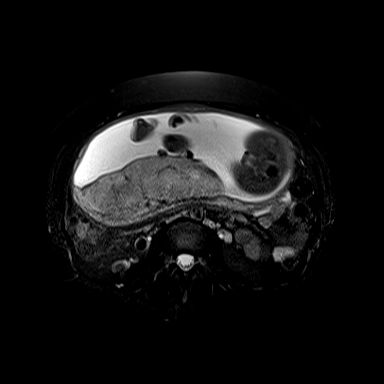
[im 33/33]
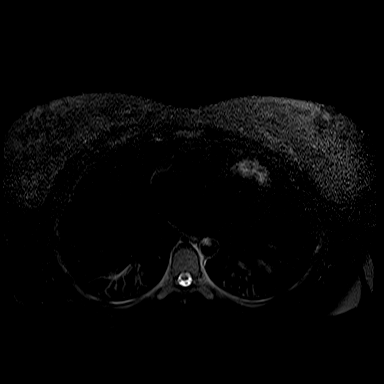

[Series 23: T2 fat-sat · axial · 5.0mm · 1.19mm/px · z∈[+69,+261]mm · 2 of 33 slices shown (1 of 3)]
[im 1/33]
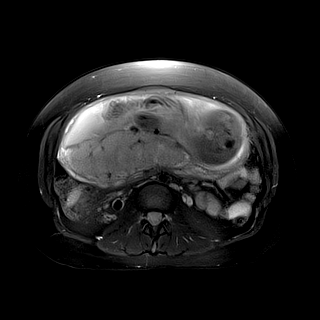
[im 33/33]
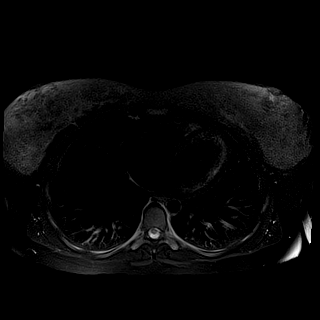

[Series 26: T2 fat-sat · axial · 5.0mm · 1.19mm/px · 1 of 2 slices shown (2 of 3)]
[im 1/2]
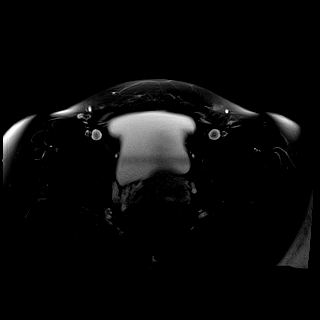

[Series 29: bSSFP · axial · 5.0mm · 1.61mm/px · z∈[+69,+261]mm · 2 of 33 slices shown (2 of 3)]
[im 1/33]
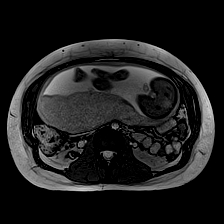
[im 33/33]
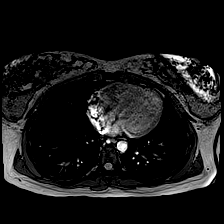

[Series 29: bSSFP · axial · 5.0mm · 0.74mm/px · z∈[-138,+66]mm · 2 of 35 slices shown (3 of 3)]
[im 1/35]
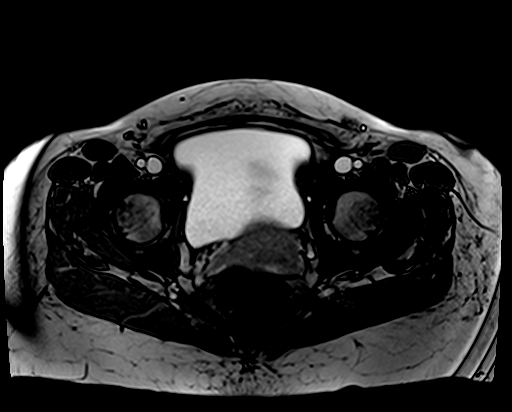
[im 35/35]
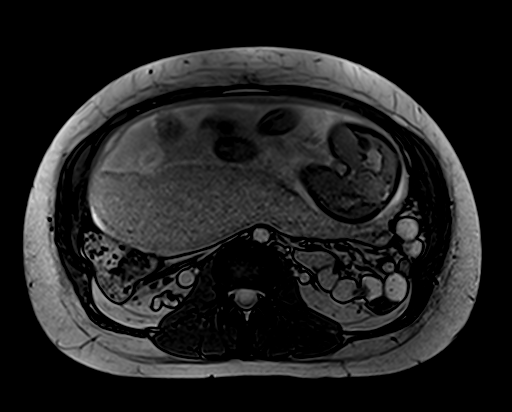

[Series 32: T2 fat-sat · axial · 5.0mm · 1.19mm/px · z∈[+69,+261]mm · 2 of 23 slices shown (3 of 3)]
[im 1/23]
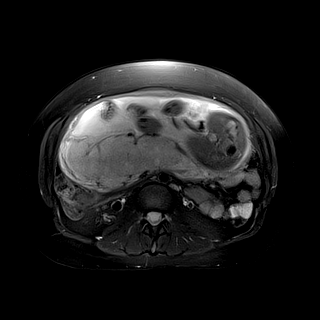
[im 23/23]
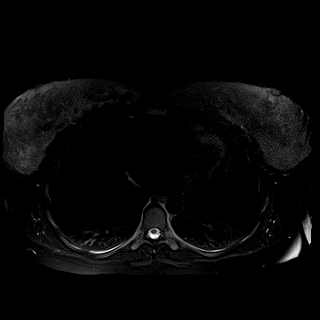

[Series 35: T1 · axial · 3.0mm · 0.70mm/px · z∈[+67,+208]mm · 3 of 40 slices shown (1 of 2)]
[im 1/40]
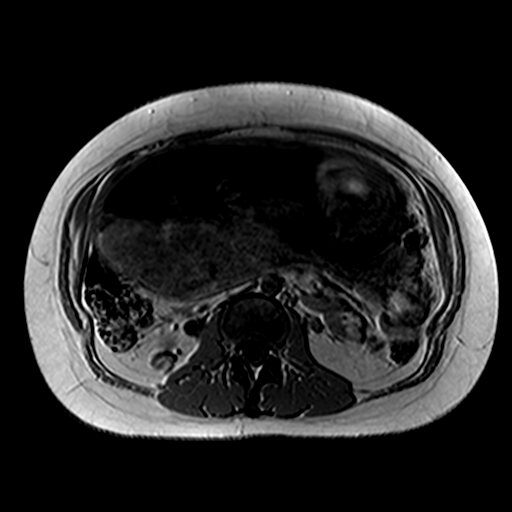
[im 20/40]
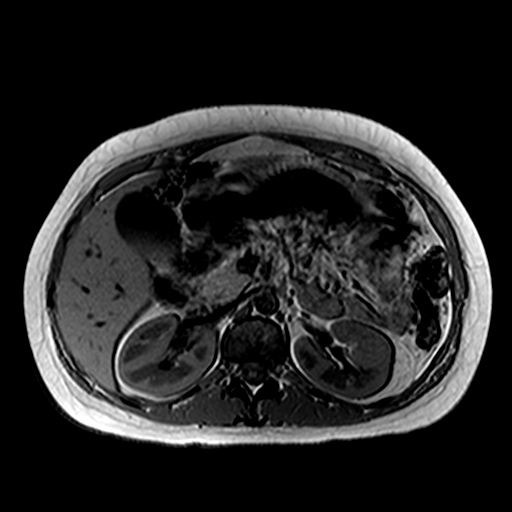
[im 40/40]
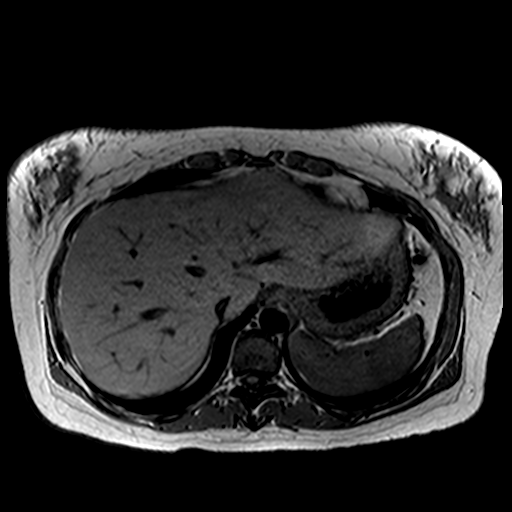

[Series 35: T1 · axial · 6.0mm · 1.48mm/px · z∈[-143,+66]mm · 2 of 30 slices shown (2 of 2)]
[im 1/30]
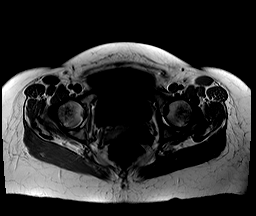
[im 30/30]
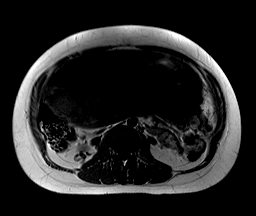

[Series 36: T1 dynamic · axial · 3.0mm · 1.41mm/px · z∈[+8,+245]mm · 6 of 80 slices shown (1 of 2)]
[im 1/80]
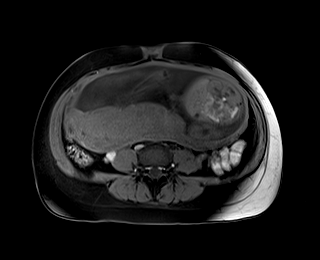
[im 16/80]
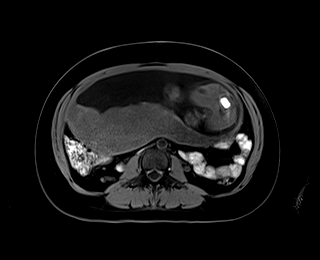
[im 32/80]
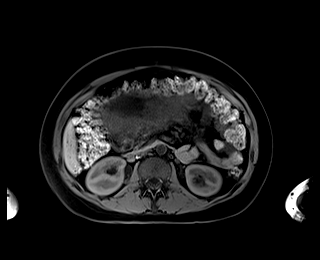
[im 48/80]
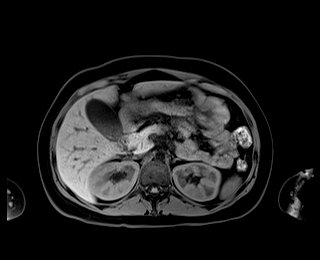
[im 64/80]
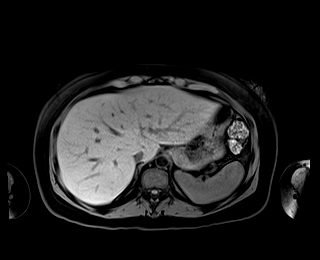
[im 80/80]
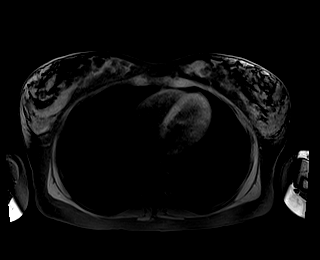

[Series 37: T1 dynamic · axial · 3.0mm · 1.41mm/px · z∈[-225,-180]mm · 2 of 80 slices shown (2 of 2)]
[im 1/80]
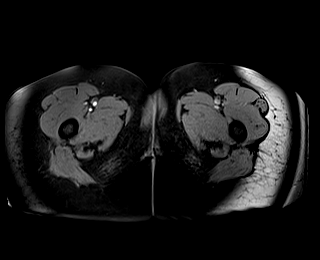
[im 16/80]
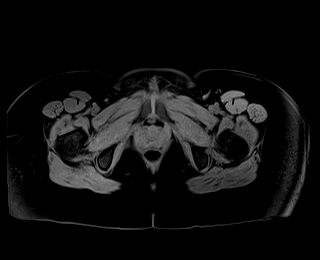

[39 of 48 positions shown; findings below may reference images not displayed]

FINDINGS: Lower chest: No sizable effusion is noted.

Hepatobiliary: Liver shows no focal mass lesion. Gallbladder is well
distended. No biliary ductal dilatation is seen. No evidence of
cholelithiasis or choledocholithiasis is noted.

Pancreas: No mass, inflammatory changes, or other parenchymal
abnormality identified.

Spleen:  Within normal limits in size and appearance.

Adrenals/Urinary Tract: Adrenal glands are within normal limits.
Kidneys are well visualized bilaterally. Mild fullness of the
collecting systems is noted bilaterally right greater than left
consistent with ureteral compression by the gravid uterus. This may
contribute to the patient's underlying discomfort.

Stomach/Bowel: The appendix is well visualized and within normal
limits. Large and small bowel are displaced by the gravid uterus but
otherwise within normal limits. Visualized stomach is decompressed.

Vascular/Lymphatic: No pathologically enlarged lymph nodes
identified. No abdominal aortic aneurysm demonstrated.

Other: Gravid uterus is seen with single intrauterine gestation
consistent with the given clinical history. The placenta is
posterior in nature. In the right ovary there is a 2.8 cm structure
identified which does not follow simple fluid characteristics and
may represent a small hemorrhagic cyst in the right ovary. No free
fluid is noted.

Musculoskeletal: No acute bony abnormality is noted.
IMPRESSION: Normal-appearing appendix.

Small 2.8 cm structure within the right ovary suspicious for
hemorrhagic cyst.

Gravid uterus consistent with the given clinical history.

Some associated mild hydronephrosis is noted bilaterally right
greater than left likely related to compression from the gravid
uterus. This could contribute to the patient's underlying
discomfort.

## 2021-05-23 MED ORDER — CYCLOBENZAPRINE HCL 5 MG PO TABS
10.0000 mg | ORAL_TABLET | Freq: Once | ORAL | Status: AC
  Administered 2021-05-23: 5 mg via ORAL
  Filled 2021-05-23: qty 2

## 2021-05-23 MED ORDER — ACETAMINOPHEN 500 MG PO TABS
1000.0000 mg | ORAL_TABLET | Freq: Once | ORAL | Status: AC
  Administered 2021-05-23: 1000 mg via ORAL
  Filled 2021-05-23: qty 2

## 2021-05-23 MED ORDER — LACTATED RINGERS IV BOLUS
1000.0000 mL | Freq: Once | INTRAVENOUS | Status: AC
  Administered 2021-05-23: 1000 mL via INTRAVENOUS

## 2021-05-23 NOTE — MAU Note (Signed)
..  Jody Carr is a 31 y.o. at [redacted]w[redacted]d here in MAU reporting: constant right sided ABD pain and cramping and tighten since 1330. She states it hurts more when she moves, and sudden sharp pain that come occasionally. Pt has not taken any pain medication, just rest and drinking water. Pt denies VB, LOF, abnormal discharge, N/V, and DFM. Last intercourse 3 days ago.   ? ?Onset of complaint: 1330 ?Pain score: 8/10 ?Vitals:  ? 05/23/21 1929  ?BP: 113/79  ?Pulse: 85  ?Resp: 18  ?SpO2: 100%  ?   ?FHT:150 ?Lab orders placed from triage:  UA ? ?

## 2021-05-24 DIAGNOSIS — R109 Unspecified abdominal pain: Secondary | ICD-10-CM | POA: Diagnosis not present

## 2021-05-24 DIAGNOSIS — O26899 Other specified pregnancy related conditions, unspecified trimester: Secondary | ICD-10-CM | POA: Diagnosis not present

## 2021-05-24 MED ORDER — CYCLOBENZAPRINE HCL 5 MG PO TABS: 5.0000 mg | ORAL_TABLET | Freq: Three times a day (TID) | ORAL | 0 refills | Status: DC | PRN

## 2021-05-24 MED ORDER — CYCLOBENZAPRINE HCL 5 MG PO TABS: 5.0000 mg | ORAL_TABLET | Freq: Three times a day (TID) | ORAL | 0 refills | Status: AC | PRN

## 2021-05-24 NOTE — MAU Provider Note (Addendum)
?History  ?  ? ?CSN: [id number] ? ?Arrival date and time: 05/23/21 1904 ? ? Event Date/Time  ? First Provider Initiated Contact with Patient 05/23/21 2004   ?  ? ?Chief Complaint  ?Patient presents with  ? Abdominal Pain  ? ?HPI ?Jody Carr is a 31 y.o. G2P1001 at [redacted]w[redacted]d who presents to MAU with chief complaint of right mid-abdominal pain. Patient was resting at a relative's house this evening when she experienced new, acute onset right lower back pain. The pain radiated laterally to her right mid-abdomen and RLQ. She denies urinary complaints, fever, flank pain, recent illness. She has not taken medication for this complaint. She denies vaginal bleeding, leaking of fluid, decreased fetal movement. ? ?OB History   ? ? Gravida  ?2  ? Para  ?1  ? Term  ?1  ? Preterm  ?   ? AB  ?   ? Living  ?1  ?  ? ? SAB  ?   ? IAB  ?   ? Ectopic  ?   ? Multiple  ?0  ? Live Births  ?1  ?   ?  ?  ? ? ?Past Medical History:  ?Diagnosis Date  ? Abnormal TSH   ? HSV (herpes simplex virus) infection   ? Hypothyroidism   ? ? ?Past Surgical History:  ?Procedure Laterality Date  ? RHINOPLASTY  02/2016  ? WISDOM TOOTH EXTRACTION    ? ? ?Family History  ?Problem Relation Age of Onset  ? Depression Mother   ? Hypertension Maternal Grandmother   ? Congestive Heart Failure Maternal Grandmother   ? Diabetes Paternal Grandmother   ? Stroke Paternal Grandfather   ? ? ?Social History  ? ?Tobacco Use  ? Smoking status: Never  ? Smokeless tobacco: Never  ?Vaping Use  ? Vaping Use: Never used  ?Substance Use Topics  ? Alcohol use: No  ? Drug use: No  ? ? ?Allergies: No Known Allergies ? ?Medications Prior to Admission  ?Medication Sig Dispense Refill Last Dose  ? levothyroxine (SYNTHROID, LEVOTHROID) 50 MCG tablet Take 50 mcg by mouth daily before breakfast.   05/22/2021  ? Prenatal Vit-Fe Fumarate-FA (PRENATAL MULTIVITAMIN) TABS tablet Take 1 tablet by mouth daily at 12 noon.   05/22/2021  ? calcium carbonate (TUMS - DOSED IN MG ELEMENTAL CALCIUM)  500 MG chewable tablet Chew 1 tablet by mouth 2 (two) times daily as needed for indigestion or heartburn.     ? ibuprofen (ADVIL,MOTRIN) 600 MG tablet Take 1 tablet (600 mg total) by mouth every 6 (six) hours. 30 tablet 0   ? lactase (LACTAID) 3000 units tablet Take 3,000 Units by mouth 2 (two) times daily as needed.     ? ? ?Review of Systems  ?Gastrointestinal:  Positive for abdominal pain.  ?All other systems reviewed and are negative. ?Physical Exam  ? ?Blood pressure 109/70, pulse 75, temperature 97.7 ?F (36.5 ?C), temperature source Oral, resp. rate 17, height 5\' 6"  (1.676 m), weight 83.6 kg, SpO2 100 %, unknown if currently breastfeeding. ? ?Physical Exam ?Vitals and nursing note reviewed. Exam conducted with a chaperone present.  ?Constitutional:   ?   Appearance: She is well-developed.  ?Cardiovascular:  ?   Rate and Rhythm: Normal rate and regular rhythm.  ?   Heart sounds: Normal heart sounds.  ?Pulmonary:  ?   Effort: Pulmonary effort is normal.  ?   Breath sounds: Normal breath sounds.  ?Abdominal:  ?   Tenderness: There  is abdominal tenderness in the right upper quadrant and right lower quadrant. There is rebound.  ?   Comments: Gravid  ?Skin: ?   General: Skin is warm and dry.  ?   Capillary Refill: Capillary refill takes less than 2 seconds.  ?Neurological:  ?   Mental Status: She is alert and oriented to person, place, and time.  ?Psychiatric:     ?   Mood and Affect: Mood normal.     ?   Behavior: Behavior normal.  ? ? ?MAU Course  ?Procedures ? ?MDM ? ?--Patient requested 5 mg Flexeril instead of 10 mg ordered. No improvement in pain score one hour after administration. Patient declines additional medication. ? ?--Reactive tracing. Baseline 135, mod var, + accels, no decels ? ?--Toco: rare contraction ? ?--Pertinent negatives: dysuria, flank pain, hematuria, fever, CVAT ? ?Orders Placed This Encounter  ?Procedures  ? MR ABDOMEN WO CONTRAST  ? MR PELVIS WO CONTRAST  ? Urinalysis, Routine w reflex  microscopic Urine, Clean Catch  ? CBC with Differential/Platelet  ? Comprehensive metabolic panel  ? Insert peripheral IV  ? ?Results for orders placed or performed during the hospital encounter of 05/23/21 (from the past 24 hour(s))  ?Urinalysis, Routine w reflex microscopic Urine, Clean Catch     Status: Abnormal  ? Collection Time: 05/23/21  7:23 PM  ?Result Value Ref Range  ? Color, Urine COLORLESS (A) YELLOW  ? APPearance CLEAR CLEAR  ? Specific Gravity, Urine 1.002 (L) 1.005 - 1.030  ? pH 7.0 5.0 - 8.0  ? Glucose, UA NEGATIVE NEGATIVE mg/dL  ? Hgb urine dipstick NEGATIVE NEGATIVE  ? Bilirubin Urine NEGATIVE NEGATIVE  ? Ketones, ur NEGATIVE NEGATIVE mg/dL  ? Protein, ur NEGATIVE NEGATIVE mg/dL  ? Nitrite NEGATIVE NEGATIVE  ? Leukocytes,Ua NEGATIVE NEGATIVE  ?CBC with Differential/Platelet     Status: Abnormal  ? Collection Time: 05/23/21  8:37 PM  ?Result Value Ref Range  ? WBC 11.2 (H) 4.0 - 10.5 K/uL  ? RBC 3.92 3.87 - 5.11 MIL/uL  ? Hemoglobin 11.5 (L) 12.0 - 15.0 g/dL  ? HCT 33.2 (L) 36.0 - 46.0 %  ? MCV 84.7 80.0 - 100.0 fL  ? MCH 29.3 26.0 - 34.0 pg  ? MCHC 34.6 30.0 - 36.0 g/dL  ? RDW 12.9 11.5 - 15.5 %  ? Platelets 250 150 - 400 K/uL  ? nRBC 0.0 0.0 - 0.2 %  ? Neutrophils Relative % 81 %  ? Neutro Abs 9.0 (H) 1.7 - 7.7 K/uL  ? Lymphocytes Relative 14 %  ? Lymphs Abs 1.5 0.7 - 4.0 K/uL  ? Monocytes Relative 5 %  ? Monocytes Absolute 0.5 0.1 - 1.0 K/uL  ? Eosinophils Relative 0 %  ? Eosinophils Absolute 0.1 0.0 - 0.5 K/uL  ? Basophils Relative 0 %  ? Basophils Absolute 0.0 0.0 - 0.1 K/uL  ? Immature Granulocytes 0 %  ? Abs Immature Granulocytes 0.02 0.00 - 0.07 K/uL  ?Comprehensive metabolic panel     Status: Abnormal  ? Collection Time: 05/23/21  8:37 PM  ?Result Value Ref Range  ? Sodium 134 (L) 135 - 145 mmol/L  ? Potassium 3.5 3.5 - 5.1 mmol/L  ? Chloride 103 98 - 111 mmol/L  ? CO2 23 22 - 32 mmol/L  ? Glucose, Bld 97 70 - 99 mg/dL  ? BUN 6 6 - 20 mg/dL  ? Creatinine, Ser 0.49 0.44 - 1.00 mg/dL  ?  Calcium 8.3 (L) 8.9 - 10.3 mg/dL  ?  Total Protein 6.1 (L) 6.5 - 8.1 g/dL  ? Albumin 2.9 (L) 3.5 - 5.0 g/dL  ? AST 20 15 - 41 U/L  ? ALT 17 0 - 44 U/L  ? Alkaline Phosphatase 70 38 - 126 U/L  ? Total Bilirubin 0.2 (L) 0.3 - 1.2 mg/dL  ? GFR, Estimated >60 >60 mL/min  ? Anion gap 8 5 - 15  ? ?MR PELVIS WO CONTRAST ? ?Result Date: 05/24/2021 ?CLINICAL DATA:  Right lower quadrant pain, history of 29 week pregnancy, initial encounter EXAM: MRI ABDOMEN AND PELVIS WITHOUT CONTRAST TECHNIQUE: Multiplanar multisequence MR imaging was performed without the administration of intravenous contrast. COMPARISON:  None. FINDINGS: Lower chest: No sizable effusion is noted. Hepatobiliary: Liver shows no focal mass lesion. Gallbladder is well distended. No biliary ductal dilatation is seen. No evidence of cholelithiasis or choledocholithiasis is noted. Pancreas: No mass, inflammatory changes, or other parenchymal abnormality identified. Spleen:  Within normal limits in size and appearance. Adrenals/Urinary Tract: Adrenal glands are within normal limits. Kidneys are well visualized bilaterally. Mild fullness of the collecting systems is noted bilaterally right greater than left consistent with ureteral compression by the gravid uterus. This may contribute to the patient's underlying discomfort. Stomach/Bowel: The appendix is well visualized and within normal limits. Large and small bowel are displaced by the gravid uterus but otherwise within normal limits. Visualized stomach is decompressed. Vascular/Lymphatic: No pathologically enlarged lymph nodes identified. No abdominal aortic aneurysm demonstrated. Other: Gravid uterus is seen with single intrauterine gestation consistent with the given clinical history. The placenta is posterior in nature. In the right ovary there is a 2.8 cm structure identified which does not follow simple fluid characteristics and may represent a small hemorrhagic cyst in the right ovary. No free fluid is  noted. Musculoskeletal: No acute bony abnormality is noted. IMPRESSION: Normal-appearing appendix. Small 2.8 cm structure within the right ovary suspicious for hemorrhagic cyst. Gravid uterus consistent with

## 2021-05-24 NOTE — MAU Note (Signed)
Returned from MRI. Pt states she continues to have sharp stabbing pain on her R side. Pt denies needs or concerns at this time. Call bell with in reach. Husband at bedside. Pt sitting up talking to spouse and watching TV. Reminded her to remain NPO until results of scan return.  ?

## 2021-07-16 LAB — OB RESULTS CONSOLE GBS: GBS: NEGATIVE

## 2021-08-05 ENCOUNTER — Telehealth (HOSPITAL_COMMUNITY): Payer: Self-pay | Admitting: *Deleted

## 2021-08-05 ENCOUNTER — Encounter (HOSPITAL_COMMUNITY): Payer: Self-pay | Admitting: *Deleted

## 2021-08-05 NOTE — Telephone Encounter (Signed)
Preadmission screen  

## 2021-08-11 ENCOUNTER — Other Ambulatory Visit: Payer: Self-pay

## 2021-08-11 ENCOUNTER — Inpatient Hospital Stay (HOSPITAL_COMMUNITY): Payer: BC Managed Care – PPO | Admitting: Anesthesiology

## 2021-08-11 ENCOUNTER — Inpatient Hospital Stay (HOSPITAL_COMMUNITY)
Admission: AD | Admit: 2021-08-11 | Discharge: 2021-08-12 | DRG: 807 | Disposition: A | Discharge status: Home / Self Care | Payer: BC Managed Care – PPO | Attending: Obstetrics and Gynecology | Admitting: Obstetrics and Gynecology

## 2021-08-11 ENCOUNTER — Encounter (HOSPITAL_COMMUNITY): Payer: Self-pay | Admitting: Obstetrics and Gynecology

## 2021-08-11 DIAGNOSIS — Z3A40 40 weeks gestation of pregnancy: Secondary | ICD-10-CM | POA: Diagnosis not present

## 2021-08-11 DIAGNOSIS — O480 Post-term pregnancy: Principal | ICD-10-CM | POA: Diagnosis present

## 2021-08-11 DIAGNOSIS — O321XX0 Maternal care for breech presentation, not applicable or unspecified: Secondary | ICD-10-CM | POA: Diagnosis not present

## 2021-08-11 DIAGNOSIS — Z3490 Encounter for supervision of normal pregnancy, unspecified, unspecified trimester: Secondary | ICD-10-CM

## 2021-08-11 LAB — TYPE AND SCREEN
ABO/RH(D): O POS
Antibody Screen: NEGATIVE

## 2021-08-11 LAB — CBC
HCT: 34.6 % — ABNORMAL LOW (ref 36.0–46.0)
Hemoglobin: 11.7 g/dL — ABNORMAL LOW (ref 12.0–15.0)
MCH: 28.3 pg (ref 26.0–34.0)
MCHC: 33.8 g/dL (ref 30.0–36.0)
MCV: 83.6 fL (ref 80.0–100.0)
Platelets: 227 10*3/uL (ref 150–400)
RBC: 4.14 MIL/uL (ref 3.87–5.11)
RDW: 13.9 % (ref 11.5–15.5)
WBC: 9 10*3/uL (ref 4.0–10.5)
nRBC: 0 % (ref 0.0–0.2)

## 2021-08-11 LAB — RPR: RPR Ser Ql: NONREACTIVE

## 2021-08-11 MED ORDER — SIMETHICONE 80 MG PO CHEW: 80.0000 mg | CHEWABLE_TABLET | ORAL | Status: DC | PRN

## 2021-08-11 MED ORDER — OXYCODONE-ACETAMINOPHEN 5-325 MG PO TABS: 2.0000 | ORAL_TABLET | ORAL | Status: DC | PRN

## 2021-08-11 MED ORDER — DIPHENHYDRAMINE HCL 50 MG/ML IJ SOLN: 12.5000 mg | INTRAMUSCULAR | Status: DC | PRN

## 2021-08-11 MED ORDER — ONDANSETRON HCL 4 MG/2ML IJ SOLN: 4.0000 mg | INTRAMUSCULAR | Status: DC | PRN

## 2021-08-11 MED ORDER — LEVOTHYROXINE SODIUM 50 MCG PO TABS
50.0000 ug | ORAL_TABLET | Freq: Every day | ORAL | Status: DC
  Administered 2021-08-11: 50 ug via ORAL
  Filled 2021-08-11: qty 1

## 2021-08-11 MED ORDER — TERBUTALINE SULFATE 1 MG/ML IJ SOLN
0.2500 mg | Freq: Once | INTRAMUSCULAR | Status: DC | PRN
Start: 2021-08-11 — End: 2021-08-11

## 2021-08-11 MED ORDER — PRENATAL MULTIVITAMIN CH
1.0000 | ORAL_TABLET | Freq: Every day | ORAL | Status: DC
  Administered 2021-08-11: 1 via ORAL
  Filled 2021-08-11: qty 1

## 2021-08-11 MED ORDER — LIDOCAINE HCL (PF) 1 % IJ SOLN
INTRAMUSCULAR | Status: DC | PRN
  Administered 2021-08-11 (×2): 4 mL via EPIDURAL

## 2021-08-11 MED ORDER — VALACYCLOVIR HCL 500 MG PO TABS: 500.0000 mg | ORAL_TABLET | Freq: Two times a day (BID) | ORAL | Status: DC

## 2021-08-11 MED ORDER — PHENYLEPHRINE 80 MCG/ML (10ML) SYRINGE FOR IV PUSH (FOR BLOOD PRESSURE SUPPORT)
80.0000 ug | PREFILLED_SYRINGE | INTRAVENOUS | Status: DC | PRN
  Administered 2021-08-11 (×2): 80 ug via INTRAVENOUS

## 2021-08-11 MED ORDER — FLEET ENEMA 7-19 GM/118ML RE ENEM: 1.0000 | ENEMA | RECTAL | Status: DC | PRN

## 2021-08-11 MED ORDER — ONDANSETRON HCL 4 MG/2ML IJ SOLN: 4.0000 mg | Freq: Four times a day (QID) | INTRAMUSCULAR | Status: DC | PRN

## 2021-08-11 MED ORDER — FENTANYL-BUPIVACAINE-NACL 0.5-0.125-0.9 MG/250ML-% EP SOLN
12.0000 mL/h | EPIDURAL | Status: DC | PRN
  Administered 2021-08-11: 12 mL/h via EPIDURAL
  Filled 2021-08-11 (×2): qty 250

## 2021-08-11 MED ORDER — OXYCODONE-ACETAMINOPHEN 5-325 MG PO TABS: 1.0000 | ORAL_TABLET | ORAL | Status: DC | PRN

## 2021-08-11 MED ORDER — IBUPROFEN 600 MG PO TABS
600.0000 mg | ORAL_TABLET | Freq: Four times a day (QID) | ORAL | Status: DC
  Administered 2021-08-11 – 2021-08-12 (×4): 600 mg via ORAL
  Filled 2021-08-11 (×4): qty 1

## 2021-08-11 MED ORDER — WITCH HAZEL-GLYCERIN EX PADS: 1.0000 "application " | MEDICATED_PAD | CUTANEOUS | Status: DC | PRN

## 2021-08-11 MED ORDER — OXYTOCIN-SODIUM CHLORIDE 30-0.9 UT/500ML-% IV SOLN
2.5000 [IU]/h | INTRAVENOUS | Status: DC
  Filled 2021-08-11: qty 500

## 2021-08-11 MED ORDER — DIPHENHYDRAMINE HCL 25 MG PO CAPS: 25.0000 mg | ORAL_CAPSULE | Freq: Four times a day (QID) | ORAL | Status: DC | PRN

## 2021-08-11 MED ORDER — LIDOCAINE HCL (PF) 1 % IJ SOLN: 30.0000 mL | INTRAMUSCULAR | Status: DC | PRN

## 2021-08-11 MED ORDER — BENZOCAINE-MENTHOL 20-0.5 % EX AERO
1.0000 "application " | INHALATION_SPRAY | CUTANEOUS | Status: DC | PRN
  Administered 2021-08-11: 1 via TOPICAL
  Filled 2021-08-11: qty 56

## 2021-08-11 MED ORDER — ONDANSETRON HCL 4 MG PO TABS: 4.0000 mg | ORAL_TABLET | ORAL | Status: DC | PRN

## 2021-08-11 MED ORDER — EPHEDRINE 5 MG/ML INJ: 10.0000 mg | INTRAVENOUS | Status: DC | PRN

## 2021-08-11 MED ORDER — LACTATED RINGERS IV SOLN: INTRAVENOUS | Status: DC

## 2021-08-11 MED ORDER — COCONUT OIL OIL: 1.0000 "application " | TOPICAL_OIL | Status: DC | PRN

## 2021-08-11 MED ORDER — ACETAMINOPHEN 325 MG PO TABS: 650.0000 mg | ORAL_TABLET | ORAL | Status: DC | PRN

## 2021-08-11 MED ORDER — TETANUS-DIPHTH-ACELL PERTUSSIS 5-2.5-18.5 LF-MCG/0.5 IM SUSY: 0.5000 mL | PREFILLED_SYRINGE | Freq: Once | INTRAMUSCULAR | Status: DC

## 2021-08-11 MED ORDER — OXYTOCIN-SODIUM CHLORIDE 30-0.9 UT/500ML-% IV SOLN
1.0000 m[IU]/min | INTRAVENOUS | Status: DC
  Administered 2021-08-11: 2 m[IU]/min via INTRAVENOUS

## 2021-08-11 MED ORDER — OXYTOCIN BOLUS FROM INFUSION
333.0000 mL | Freq: Once | INTRAVENOUS | Status: AC
  Administered 2021-08-11: 333 mL via INTRAVENOUS

## 2021-08-11 MED ORDER — PHENYLEPHRINE 80 MCG/ML (10ML) SYRINGE FOR IV PUSH (FOR BLOOD PRESSURE SUPPORT)
80.0000 ug | PREFILLED_SYRINGE | INTRAVENOUS | Status: DC | PRN
  Filled 2021-08-11: qty 10

## 2021-08-11 MED ORDER — LACTATED RINGERS IV SOLN: 500.0000 mL | INTRAVENOUS | Status: DC | PRN

## 2021-08-11 MED ORDER — SOD CITRATE-CITRIC ACID 500-334 MG/5ML PO SOLN: 30.0000 mL | ORAL | Status: DC | PRN

## 2021-08-11 MED ORDER — ACETAMINOPHEN 325 MG PO TABS
650.0000 mg | ORAL_TABLET | ORAL | Status: DC | PRN
  Administered 2021-08-12: 650 mg via ORAL
  Filled 2021-08-11: qty 2

## 2021-08-11 MED ORDER — SENNOSIDES-DOCUSATE SODIUM 8.6-50 MG PO TABS
2.0000 | ORAL_TABLET | ORAL | Status: DC
  Administered 2021-08-12: 2 via ORAL
  Filled 2021-08-11: qty 2

## 2021-08-11 MED ORDER — LACTATED RINGERS IV SOLN
500.0000 mL | Freq: Once | INTRAVENOUS | Status: AC
  Administered 2021-08-11: 500 mL via INTRAVENOUS

## 2021-08-11 MED ORDER — FENTANYL CITRATE (PF) 100 MCG/2ML IJ SOLN: 50.0000 ug | INTRAMUSCULAR | Status: DC | PRN

## 2021-08-11 MED ORDER — DIBUCAINE (PERIANAL) 1 % EX OINT: 1.0000 "application " | TOPICAL_OINTMENT | CUTANEOUS | Status: DC | PRN

## 2021-08-11 MED ORDER — ZOLPIDEM TARTRATE 5 MG PO TABS: 5.0000 mg | ORAL_TABLET | Freq: Every evening | ORAL | Status: DC | PRN

## 2021-08-11 NOTE — Lactation Note (Signed)
This note was copied from a baby's chart. Lactation Consultation Note  Patient Name: Jody Carr [id number] Date: 08/11/2021 Reason for consult: Initial assessment;Mother's request;Difficult latch;Term;Maternal endocrine disorder;Breastfeeding assistance Age:31 hours   LC assisted with latching infant at the breast for 6 mins, then she fell asleep.  Mom given hand pump to pre pump before latching for 6 x on each breast.   Plan 1. To feed based on cues 8-12x 24hr period. Mom to offer breasts and look for signs of milk transfer.  2. If unable to latch, Mom to offer EBM via spoon 5-7 ml, then try a latch.   All questions answered at the end of the visit.  Mom electric pump at home.    Maternal Data Has patient been taught Hand Expression?: Yes Does the patient have breastfeeding experience prior to this delivery?: Yes How long did the patient breastfeed?: 7 months  Feeding Mother's Current Feeding Choice: Breast Milk  LATCH Score Latch: Repeated attempts needed to sustain latch, nipple held in mouth throughout feeding, stimulation needed to elicit sucking reflex.  Audible Swallowing: A few with stimulation  Type of Nipple: Everted at rest and after stimulation  Comfort (Breast/Nipple): Soft / non-tender  Hold (Positioning): Assistance needed to correctly position infant at breast and maintain latch.  LATCH Score: 7   Lactation Tools Discussed/Used Tools: Pump;Flanges Flange Size: 21 Breast pump type: Manual Pump Education: Setup, frequency, and cleaning;Milk Storage Reason for Pumping: elongate nipple Pumping frequency: pre pump 6x on each breast before latching  Interventions Interventions: Breast feeding basics reviewed;Assisted with latch;Skin to skin;Breast massage;Hand express;Pre-pump if needed;Breast compression;Adjust position;Support pillows;Position options;Expressed milk;Hand pump;Education;LC Psychologist, educational;Infant Driven Feeding Algorithm  education  Discharge Pump: Personal;Manual WIC Program: No  Consult Status Consult Status: Follow-up Date: 08/12/21 Follow-up type: In-patient    Lesly Pontarelli  Nicholson-Springer 08/11/2021, 1:38 PM

## 2021-08-11 NOTE — MAU Note (Signed)
.  Jody Carr is a 31 y.o. at [redacted]w[redacted]d here in MAU reporting ctxs since 1900 last night. Denies VB or LOF. Good FM. 2.5cm this past Mon at office   Onset of complaint: last night Pain score: 9    FHT:147 Lab orders placed from triage:  none BP 128 80 P 83 R 17 Temp 98.1

## 2021-08-11 NOTE — Progress Notes (Signed)
Labor Progress Note  Patient doing well, epidural in place.  Cervix stretches to 7 / 80 / -2, head well applied.  AROM completed in typical fashion with return of clear fluid.  Patient tolerated well.  FHT reactive and reassuring, small early/variable decels with contractions.  Accels present.  Nilda Simmer

## 2021-08-11 NOTE — Progress Notes (Signed)
Messaged Dr. Lorane Gell via Epic Secure chat to notify him of borderline BP's.  Pt is asymptomatic and bleeding has been WNL.  Pain is under control (only taking scheduled Ibuprofen).  Has been emptying bladder q2hrs.   Left leg continues to feel "heavy and tingly" but is improving slowly.  Night shift RN updated.    Will continue to monitor, no further orders received.

## 2021-08-11 NOTE — Anesthesia Procedure Notes (Signed)
Epidural Patient location during procedure: OB Start time: 08/11/2021 6:15 AM End time: 08/11/2021 6:18 AM  Staffing Anesthesiologist: Kaylyn Layer, MD Performed: anesthesiologist   Preanesthetic Checklist Completed: patient identified, IV checked, risks and benefits discussed, monitors and equipment checked, pre-op evaluation and timeout performed  Epidural Patient position: sitting Prep: DuraPrep and site prepped and draped Patient monitoring: continuous pulse ox, blood pressure and heart rate Approach: midline Location: L3-L4 Injection technique: LOR air  Needle:  Needle type: Tuohy  Needle gauge: 17 G Needle length: 9 cm Needle insertion depth: 5 cm Catheter type: closed end flexible Catheter size: 19 Gauge Catheter at skin depth: 10 cm Test dose: negative and Other (1% lidocaine)  Assessment Events: blood not aspirated, injection not painful, no injection resistance, no paresthesia and negative IV test  Additional Notes Patient identified. Risks, benefits, and alternatives discussed with patient including but not limited to bleeding, infection, nerve damage, paralysis, failed block, incomplete pain control, headache, blood pressure changes, nausea, vomiting, reactions to medication, itching, and postpartum back pain. Confirmed with bedside nurse the patient's most recent platelet count. Confirmed with patient that they are not currently taking any anticoagulation, have any bleeding history, or any family history of bleeding disorders. Patient expressed understanding and wished to proceed. All questions were answered. Sterile technique was used throughout the entire procedure. Please see nursing notes for vital signs.   Crisp LOR on first pass. Test dose was given through epidural catheter and negative prior to continuing to dose epidural or start infusion. Warning signs of high block given to the patient including shortness of breath, tingling/numbness in hands, complete  motor block, or any concerning symptoms with instructions to call for help. Patient was given instructions on fall risk and not to get out of bed. All questions and concerns addressed with instructions to call with any issues or inadequate analgesia.  Reason for block:procedure for pain

## 2021-08-11 NOTE — Progress Notes (Signed)
To BS via W/C. FHR 150 when EFM d/ced for transfer

## 2021-08-11 NOTE — H&P (Signed)
OB History and Physical   Memory A Jody Carr is a 31 y.o. female G2P1001 presenting for labor at [redacted]w[redacted]d.  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 She reports no active lesions or pain/burning.   Most recent US at 32 weeks with EFW 33%ile.  Breech presentation was noted at this time but baby has been consistently vertex in the office since.   Rh positive, GBS negative, panorama low risk.    OB History     Gravida  2   Para  1   Term  1   Preterm      AB      Living  1      SAB      IAB      Ectopic      Multiple  0   Live Births  1          Past Medical History:  Diagnosis Date   Abnormal TSH    HSV (herpes simplex virus) infection    Hypothyroidism    Past Surgical History:  Procedure Laterality Date   RHINOPLASTY  02/2016   WISDOM TOOTH EXTRACTION     Family History: family history includes Congestive Heart Failure in her maternal grandmother; Depression in her mother; Diabetes in her paternal grandmother; Hypertension in her maternal grandmother; Stroke in her paternal grandfather. Social History:  reports that she has never smoked. She has never used smokeless tobacco. She reports that she does not drink alcohol and does not use drugs.     Maternal Diabetes: No Genetic Screening: Normal Maternal Ultrasounds/Referrals: Normal Fetal Ultrasounds or other Referrals:  None Maternal Substance Abuse:  No Significant Maternal Medications:  Synthroid Significant Maternal Lab Results:  Group B Strep negative Other Comments:  None  Review of Systems - Patient denies fever, chills, SOB, CP, N/V/D.  History Dilation: 5 Effacement (%): 70 Station: Ballotable Exam by:: Jody Carr RNc Blood pressure 116/67, pulse 78, temperature (!) 97.4 F (36.3 C), temperature source Oral, resp. rate 16, height 5\' 6"  (1.676 m), weight 86.2 kg, SpO2 99 %, unknown if currently breastfeeding. Exam Physical Exam   Gen:  alert, well appearing, no distress Chest: nonlabored breathing CV: no peripheral edema Abdomen: gravid, ctx present Ext: no evidence of DVT  Prenatal labs: ABO, Rh: --/--/O POS (06/14 0539) Antibody: NEG (06/14 0539) Rubella: Immune (11/04 0000) RPR: Nonreactive (11/04 0000)  HBsAg: Negative (11/04 0000)  HIV: Non-reactive (11/04 0000)  GBS: Negative/-- (05/19 0000)   Assessment/Plan: Admit to Labor and Delivery Epidural placed Hx of HSV, on Ppx valtrex, no lesions.  Anticipate vaginal delivery.    06-28-1975 08/11/2021, 7:29 AM

## 2021-08-11 NOTE — Anesthesia Preprocedure Evaluation (Signed)
Anesthesia Evaluation  Patient identified by MRN, date of birth, ID band Patient awake    Reviewed: Allergy & Precautions, Patient's Chart, lab work & pertinent test results  History of Anesthesia Complications Negative for: history of anesthetic complications  Airway Mallampati: II  TM Distance: >3 FB Neck ROM: Full    Dental no notable dental hx.    Pulmonary neg pulmonary ROS,    Pulmonary exam normal        Cardiovascular negative cardio ROS Normal cardiovascular exam     Neuro/Psych negative neurological ROS  negative psych ROS   GI/Hepatic negative GI ROS, Neg liver ROS,   Endo/Other  Hypothyroidism   Renal/GU negative Renal ROS  negative genitourinary   Musculoskeletal negative musculoskeletal ROS (+)   Abdominal   Peds  Hematology negative hematology ROS (+)   Anesthesia Other Findings Day of surgery medications reviewed with patient.  Reproductive/Obstetrics (+) Pregnancy                             Anesthesia Physical Anesthesia Plan  ASA: 2  Anesthesia Plan: Epidural   Post-op Pain Management:    Induction:   PONV Risk Score and Plan: Treatment may vary due to age or medical condition  Airway Management Planned: Natural Airway  Additional Equipment: Fetal Monitoring  Intra-op Plan:   Post-operative Plan:   Informed Consent: I have reviewed the patients History and Physical, chart, labs and discussed the procedure including the risks, benefits and alternatives for the proposed anesthesia with the patient or authorized representative who has indicated his/her understanding and acceptance.       Plan Discussed with:   Anesthesia Plan Comments:         Anesthesia Quick Evaluation  

## 2021-08-12 LAB — CBC
HCT: 30.7 % — ABNORMAL LOW (ref 36.0–46.0)
Hemoglobin: 10.7 g/dL — ABNORMAL LOW (ref 12.0–15.0)
MCH: 29 pg (ref 26.0–34.0)
MCHC: 34.9 g/dL (ref 30.0–36.0)
MCV: 83.2 fL (ref 80.0–100.0)
Platelets: 214 10*3/uL (ref 150–400)
RBC: 3.69 MIL/uL — ABNORMAL LOW (ref 3.87–5.11)
RDW: 13.9 % (ref 11.5–15.5)
WBC: 9.7 10*3/uL (ref 4.0–10.5)
nRBC: 0 % (ref 0.0–0.2)

## 2021-08-12 MED ORDER — IBUPROFEN 600 MG PO TABS: 600.0000 mg | ORAL_TABLET | Freq: Four times a day (QID) | ORAL | 0 refills | Status: AC | PRN

## 2021-08-12 NOTE — Discharge Summary (Signed)
Postpartum Discharge Summary       Patient Name: Jody Carr DOB: Jun 25, 1990 MRN: [id number]  Date of admission: 08/11/2021 Delivery date:08/11/2021  Delivering provider: Eyvonne Mechanic A  Date of discharge: 08/12/2021  Admitting diagnosis: Term pregnancy [Z34.90] Intrauterine pregnancy: [redacted]w[redacted]d     Secondary diagnosis:  Principal Problem:   Term pregnancy  Additional problems:      Discharge diagnosis: Term Pregnancy Delivered                                              Post partum procedures:   Augmentation: AROM Complications: None  Hospital course: Onset of Labor With Vaginal Delivery      31 y.o. yo [id number] at [redacted]w[redacted]d was admitted in Active Labor on 08/11/2021. Patient had an uncomplicated labor course as follows:  Membrane Rupture Time/Date: 7:48 AM ,08/11/2021   Delivery Method:Vaginal, Spontaneous  Episiotomy: None  Lacerations:  2nd degree;Sulcus  Patient had an uncomplicated postpartum course.  She is ambulating, tolerating a regular diet, passing flatus, and urinating well. Patient is discharged home in stable condition on 08/12/21.  Newborn Data: Birth date:08/11/2021  Birth time:10:19 AM  Gender:Female  Living status:Living  Apgars:8 ,9  Weight:3300 g   Magnesium Sulfate received: No BMZ received: No Rhophylac:N/A MMR:N/A T-DaP:Given prenatally Flu: N/A Transfusion:No  Physical exam  Vitals:   08/11/21 1827 08/11/21 2156 08/12/21 0131 08/12/21 0529  BP: 115/86 117/85 103/70 114/85  Pulse: 94 71 79 68  Resp:  $Remo'16 16 16  'jdZet$ Temp:  98.2 F (36.8 C) 98.1 F (36.7 C) 98 F (36.7 C)  TempSrc:  Oral Oral Oral  SpO2: 98% 98% 98% 98%  Weight:      Height:       General: alert, cooperative, and no distress Lochia: appropriate Uterine Fundus: firm Incision: Healing well with no significant drainage DVT Evaluation: No evidence of DVT seen on physical exam. Labs: Lab Results  Component Value Date   WBC 9.7 08/12/2021   HGB 10.7 (L) 08/12/2021    HCT 30.7 (L) 08/12/2021   MCV 83.2 08/12/2021   PLT 214 08/12/2021      Latest Ref Rng & Units 05/23/2021    8:37 PM  CMP  Glucose 70 - 99 mg/dL 97   BUN 6 - 20 mg/dL 6   Creatinine 0.44 - 1.00 mg/dL 0.49   Sodium 135 - 145 mmol/L 134   Potassium 3.5 - 5.1 mmol/L 3.5   Chloride 98 - 111 mmol/L 103   CO2 22 - 32 mmol/L 23   Calcium 8.9 - 10.3 mg/dL 8.3   Total Protein 6.5 - 8.1 g/dL 6.1   Total Bilirubin 0.3 - 1.2 mg/dL 0.2   Alkaline Phos 38 - 126 U/L 70   AST 15 - 41 U/L 20   ALT 0 - 44 U/L 17    Edinburgh Score:    08/11/2021    2:57 PM  Edinburgh Postnatal Depression Scale Screening Tool  I have been able to laugh and see the funny side of things. 0  I have looked forward with enjoyment to things. 0  I have blamed myself unnecessarily when things went wrong. 1  I have been anxious or worried for no good reason. 2  I have felt scared or panicky for no good reason. 2  Things have been getting on top of me. 1  I have been so unhappy that I have had difficulty sleeping. 0  I have felt sad or miserable. 0  I have been so unhappy that I have been crying. 0  The thought of harming myself has occurred to me. 0  Edinburgh Postnatal Depression Scale Total 6      After visit meds:  Allergies as of 08/12/2021   No Known Allergies      Medication List     TAKE these medications    calcium carbonate 500 MG chewable tablet Commonly known as: TUMS - dosed in mg elemental calcium Chew 1 tablet by mouth 2 (two) times daily as needed for indigestion or heartburn.   cyclobenzaprine 5 MG tablet Commonly known as: FLEXERIL Take 1 tablet (5 mg total) by mouth 3 (three) times daily as needed for muscle spasms.   ibuprofen 600 MG tablet Commonly known as: ADVIL Take 1 tablet (600 mg total) by mouth every 6 (six) hours as needed.   lactase 3000 units tablet Commonly known as: LACTAID Take 3,000 Units by mouth 2 (two) times daily as needed.   levothyroxine 50 MCG  tablet Commonly known as: SYNTHROID Take 50 mcg by mouth daily before breakfast.   prenatal multivitamin Tabs tablet Take 1 tablet by mouth daily at 12 noon.   valACYclovir 500 MG tablet Commonly known as: VALTREX Take 500 mg by mouth 2 (two) times daily.         Discharge home in stable condition Infant Feeding: Breast Infant Disposition:home with mother Discharge instruction: per After Visit Summary and Postpartum booklet. Activity: Advance as tolerated. Pelvic rest for 6 weeks.  Diet: routine diet Anticipated Birth Control: Unsure Postpartum Appointment:6 weeks Additional Postpartum F/U:    Future Appointments:No future appointments. Follow up Visit:      08/12/2021 Luz Lex, MD

## 2021-08-12 NOTE — Lactation Note (Signed)
This note was copied from a baby's chart. Lactation Consultation Note  Patient Name: Jody Carr Date: 08/12/2021   Age:31 hours Prior to [facility] visit per mom baby had been to the breast 5 mins and then supplemented with 30 ml of formula . Baby fussy , LC offered to check the diaper and it was wet. LC placed baby to the left breast / football / and sucked for few times and released. Baby did not seem interested.  LC recommended since the baby has had bottles if prior to latching  Breast massage, hand express, pre-pump, and reverse pressure. Latch with firm support. Warm moist compress on breast to enhance flow until milk comes in.  Latch 8 .  LC reviewed BF D/C teaching and mom aware of the [facility] resources after D/C.   Maternal Data    Feeding    LATCH Score Latch: Grasps breast easily, tongue down, lips flanged, rhythmical sucking.  Audible Swallowing: A few with stimulation  Type of Nipple: Everted at rest and after stimulation (areola compressible)  Comfort (Breast/Nipple): Soft / non-tender  Hold (Positioning): Assistance needed to correctly position infant at breast and maintain latch.  LATCH Score: 8   Lactation Tools Discussed/Used    Interventions Interventions: Breast feeding basics reviewed;Assisted with latch;Skin to skin;Breast massage  Discharge Discharge Education: Engorgement and breast care;Warning signs for feeding baby Pump: Manual  Consult Status Consult Status: Complete Date: 08/12/21    Kathrin Greathouse 08/12/2021, 10:32 AM

## 2021-08-12 NOTE — Anesthesia Postprocedure Evaluation (Signed)
Anesthesia Post Note  Patient: Jody Carr  Procedure(s) Performed: AN AD HOC LABOR EPIDURAL     Patient location during evaluation: Mother Baby Anesthesia Type: Epidural Level of consciousness: awake Pain management: satisfactory to patient Vital Signs Assessment: post-procedure vital signs reviewed and stable Respiratory status: spontaneous breathing Cardiovascular status: stable Anesthetic complications: no  Pt. Walked out of hospital.  No complications per RN No notable events documented.  Last Vitals:  Vitals:   08/12/21 0131 08/12/21 0529  BP: 103/70 114/85  Pulse: 79 68  Resp: 16 16  Temp: 36.7 C 36.7 C  SpO2: 98% 98%    Last Pain:  Vitals:   08/12/21 1041  TempSrc:   PainSc: 4    Pain Goal: Patients Stated Pain Goal: 0 (08/11/21 0453)                 Cephus Shelling

## 2021-08-16 ENCOUNTER — Inpatient Hospital Stay (HOSPITAL_COMMUNITY): Payer: BC Managed Care – PPO

## 2021-08-16 ENCOUNTER — Inpatient Hospital Stay (HOSPITAL_COMMUNITY)
Admission: AD | Admit: 2021-08-16 | Payer: BC Managed Care – PPO | Source: Home / Self Care | Admitting: Obstetrics & Gynecology

## 2021-08-19 ENCOUNTER — Telehealth (HOSPITAL_COMMUNITY): Payer: Self-pay | Admitting: *Deleted

## 2021-11-28 DIAGNOSIS — Z419 Encounter for procedure for purposes other than remedying health state, unspecified: Secondary | ICD-10-CM | POA: Diagnosis not present

## 2021-12-29 DIAGNOSIS — Z419 Encounter for procedure for purposes other than remedying health state, unspecified: Secondary | ICD-10-CM | POA: Diagnosis not present

## 2022-01-28 DIAGNOSIS — Z419 Encounter for procedure for purposes other than remedying health state, unspecified: Secondary | ICD-10-CM | POA: Diagnosis not present

## 2022-02-03 DIAGNOSIS — J0100 Acute maxillary sinusitis, unspecified: Secondary | ICD-10-CM | POA: Diagnosis not present

## 2022-02-03 DIAGNOSIS — J029 Acute pharyngitis, unspecified: Secondary | ICD-10-CM | POA: Diagnosis not present

## 2022-02-28 DIAGNOSIS — Z419 Encounter for procedure for purposes other than remedying health state, unspecified: Secondary | ICD-10-CM | POA: Diagnosis not present

## 2022-03-31 DIAGNOSIS — Z419 Encounter for procedure for purposes other than remedying health state, unspecified: Secondary | ICD-10-CM | POA: Diagnosis not present

## 2022-04-29 DIAGNOSIS — Z419 Encounter for procedure for purposes other than remedying health state, unspecified: Secondary | ICD-10-CM | POA: Diagnosis not present

## 2022-05-30 DIAGNOSIS — Z419 Encounter for procedure for purposes other than remedying health state, unspecified: Secondary | ICD-10-CM | POA: Diagnosis not present

## 2022-06-29 DIAGNOSIS — Z419 Encounter for procedure for purposes other than remedying health state, unspecified: Secondary | ICD-10-CM | POA: Diagnosis not present

## 2022-07-30 DIAGNOSIS — Z419 Encounter for procedure for purposes other than remedying health state, unspecified: Secondary | ICD-10-CM | POA: Diagnosis not present
